# Patient Record
Sex: Female | Born: 1945 | Race: White | Hispanic: No | Smoking: Never smoker
Health system: Southern US, Community
[De-identification: ages and names within clinical notes are randomized; demographics above are authoritative.]

## PROBLEM LIST (undated history)

## (undated) DIAGNOSIS — C50919 Malignant neoplasm of unspecified site of unspecified female breast: Secondary | ICD-10-CM

## (undated) DIAGNOSIS — M1990 Unspecified osteoarthritis, unspecified site: Secondary | ICD-10-CM

## (undated) DIAGNOSIS — R51 Headache: Secondary | ICD-10-CM

## (undated) DIAGNOSIS — Z860100 Personal history of colon polyps, unspecified: Secondary | ICD-10-CM

## (undated) DIAGNOSIS — Z923 Personal history of irradiation: Secondary | ICD-10-CM

## (undated) DIAGNOSIS — G4730 Sleep apnea, unspecified: Secondary | ICD-10-CM

## (undated) DIAGNOSIS — F32A Depression, unspecified: Secondary | ICD-10-CM

## (undated) DIAGNOSIS — H8110 Benign paroxysmal vertigo, unspecified ear: Secondary | ICD-10-CM

## (undated) DIAGNOSIS — C801 Malignant (primary) neoplasm, unspecified: Secondary | ICD-10-CM

## (undated) DIAGNOSIS — R519 Headache, unspecified: Secondary | ICD-10-CM

## (undated) DIAGNOSIS — R112 Nausea with vomiting, unspecified: Secondary | ICD-10-CM

## (undated) DIAGNOSIS — Z9889 Other specified postprocedural states: Secondary | ICD-10-CM

## (undated) DIAGNOSIS — H9190 Unspecified hearing loss, unspecified ear: Secondary | ICD-10-CM

## (undated) DIAGNOSIS — K219 Gastro-esophageal reflux disease without esophagitis: Secondary | ICD-10-CM

## (undated) DIAGNOSIS — F329 Major depressive disorder, single episode, unspecified: Secondary | ICD-10-CM

## (undated) HISTORY — PX: APPENDECTOMY: SHX54

## (undated) HISTORY — PX: BREAST SURGERY: SHX581

## (undated) HISTORY — PX: ABDOMINAL HYSTERECTOMY: SHX81

## (undated) HISTORY — PX: COLONOSCOPY: SHX174

## (undated) HISTORY — PX: TUBAL LIGATION: SHX77

## (undated) HISTORY — PX: CHOLECYSTECTOMY: SHX55

---

## 2000-11-07 DIAGNOSIS — C50919 Malignant neoplasm of unspecified site of unspecified female breast: Secondary | ICD-10-CM

## 2000-11-07 HISTORY — DX: Malignant neoplasm of unspecified site of unspecified female breast: C50.919

## 2000-11-07 HISTORY — PX: BREAST EXCISIONAL BIOPSY: SUR124

## 2004-10-11 ENCOUNTER — Ambulatory Visit: Payer: Self-pay | Admitting: Oncology

## 2004-11-07 ENCOUNTER — Ambulatory Visit: Payer: Self-pay | Admitting: Oncology

## 2005-03-27 ENCOUNTER — Emergency Department: Payer: Self-pay | Admitting: Emergency Medicine

## 2005-04-01 ENCOUNTER — Emergency Department: Payer: Self-pay | Admitting: Internal Medicine

## 2005-04-11 ENCOUNTER — Ambulatory Visit: Payer: Self-pay | Admitting: Oncology

## 2005-05-07 ENCOUNTER — Ambulatory Visit: Payer: Self-pay | Admitting: Oncology

## 2005-06-07 ENCOUNTER — Ambulatory Visit: Payer: Self-pay | Admitting: Oncology

## 2005-07-08 ENCOUNTER — Ambulatory Visit: Payer: Self-pay | Admitting: Oncology

## 2005-11-25 ENCOUNTER — Ambulatory Visit: Payer: Self-pay | Admitting: Oncology

## 2005-12-08 ENCOUNTER — Ambulatory Visit: Payer: Self-pay | Admitting: Oncology

## 2006-05-25 ENCOUNTER — Ambulatory Visit: Payer: Self-pay | Admitting: Oncology

## 2006-06-07 ENCOUNTER — Ambulatory Visit: Payer: Self-pay | Admitting: Oncology

## 2006-07-08 ENCOUNTER — Ambulatory Visit: Payer: Self-pay | Admitting: Oncology

## 2006-11-16 ENCOUNTER — Ambulatory Visit: Payer: Self-pay | Admitting: Gastroenterology

## 2006-12-12 ENCOUNTER — Ambulatory Visit: Payer: Self-pay | Admitting: Oncology

## 2006-12-21 ENCOUNTER — Ambulatory Visit: Payer: Self-pay | Admitting: Oncology

## 2007-01-06 ENCOUNTER — Ambulatory Visit: Payer: Self-pay | Admitting: Oncology

## 2007-06-08 ENCOUNTER — Ambulatory Visit: Payer: Self-pay | Admitting: Oncology

## 2007-06-13 ENCOUNTER — Ambulatory Visit: Payer: Self-pay | Admitting: Oncology

## 2007-06-19 ENCOUNTER — Ambulatory Visit: Payer: Self-pay | Admitting: Oncology

## 2007-06-24 ENCOUNTER — Ambulatory Visit: Payer: Self-pay | Admitting: Orthopedic Surgery

## 2007-07-09 ENCOUNTER — Ambulatory Visit: Payer: Self-pay | Admitting: Oncology

## 2007-09-10 ENCOUNTER — Ambulatory Visit: Payer: Self-pay | Admitting: Internal Medicine

## 2007-09-11 ENCOUNTER — Ambulatory Visit: Payer: Self-pay | Admitting: Internal Medicine

## 2007-09-13 ENCOUNTER — Ambulatory Visit: Payer: Self-pay | Admitting: General Surgery

## 2007-12-09 ENCOUNTER — Ambulatory Visit: Payer: Self-pay | Admitting: Oncology

## 2007-12-17 ENCOUNTER — Ambulatory Visit: Payer: Self-pay | Admitting: Oncology

## 2008-01-06 ENCOUNTER — Ambulatory Visit: Payer: Self-pay | Admitting: Oncology

## 2008-04-07 ENCOUNTER — Ambulatory Visit: Payer: Self-pay | Admitting: Oncology

## 2008-06-07 ENCOUNTER — Ambulatory Visit: Payer: Self-pay | Admitting: Oncology

## 2008-06-16 ENCOUNTER — Ambulatory Visit: Payer: Self-pay | Admitting: Oncology

## 2008-06-26 ENCOUNTER — Ambulatory Visit: Payer: Self-pay | Admitting: Oncology

## 2008-07-08 ENCOUNTER — Ambulatory Visit: Payer: Self-pay | Admitting: Oncology

## 2009-06-07 ENCOUNTER — Ambulatory Visit: Payer: Self-pay | Admitting: Oncology

## 2009-06-17 ENCOUNTER — Ambulatory Visit: Payer: Self-pay | Admitting: Oncology

## 2009-06-25 ENCOUNTER — Ambulatory Visit: Payer: Self-pay | Admitting: Oncology

## 2009-07-08 ENCOUNTER — Ambulatory Visit: Payer: Self-pay | Admitting: Oncology

## 2010-06-07 ENCOUNTER — Ambulatory Visit: Payer: Self-pay | Admitting: Oncology

## 2010-06-18 ENCOUNTER — Ambulatory Visit: Payer: Self-pay | Admitting: Oncology

## 2010-06-25 ENCOUNTER — Ambulatory Visit: Payer: Self-pay | Admitting: Oncology

## 2010-06-27 LAB — CANCER ANTIGEN 27.29: CA 27.29: 31.8 U/mL (ref 0.0–38.6)

## 2010-07-08 ENCOUNTER — Ambulatory Visit: Payer: Self-pay | Admitting: Oncology

## 2011-06-20 ENCOUNTER — Ambulatory Visit: Payer: Self-pay | Admitting: Oncology

## 2011-06-23 ENCOUNTER — Ambulatory Visit: Payer: Self-pay | Admitting: Ophthalmology

## 2011-06-23 DIAGNOSIS — I119 Hypertensive heart disease without heart failure: Secondary | ICD-10-CM

## 2011-06-24 ENCOUNTER — Ambulatory Visit: Payer: Self-pay | Admitting: Oncology

## 2011-06-29 ENCOUNTER — Ambulatory Visit: Payer: Self-pay | Admitting: Ophthalmology

## 2011-07-09 ENCOUNTER — Ambulatory Visit: Payer: Self-pay | Admitting: Oncology

## 2012-03-26 ENCOUNTER — Ambulatory Visit: Payer: Self-pay | Admitting: Gastroenterology

## 2012-03-27 LAB — PATHOLOGY REPORT

## 2012-07-03 ENCOUNTER — Ambulatory Visit: Payer: Self-pay | Admitting: Internal Medicine

## 2012-10-28 ENCOUNTER — Ambulatory Visit: Payer: Self-pay | Admitting: Emergency Medicine

## 2012-10-28 LAB — URINALYSIS, COMPLETE
Bilirubin,UR: NEGATIVE
Ketone: NEGATIVE
Nitrite: POSITIVE
Ph: 6 (ref 4.5–8.0)
Protein: 300
RBC,UR: >30 /HPF (ref 0–5)
Specific Gravity: >=1.03 (ref 1.003–1.030)

## 2012-10-30 LAB — URINE CULTURE

## 2013-07-17 ENCOUNTER — Ambulatory Visit: Payer: Self-pay | Admitting: Internal Medicine

## 2013-12-18 IMAGING — MG MM CAD DIAGNOSTIC MAMMO
1 series · 7 of 7 positions shown · non-contrast
Comparison: none

REASON FOR EXAM: hx brst ca
COMMENTS:

[R CC · right · 7 of 7 slices shown]
[im 1/7]
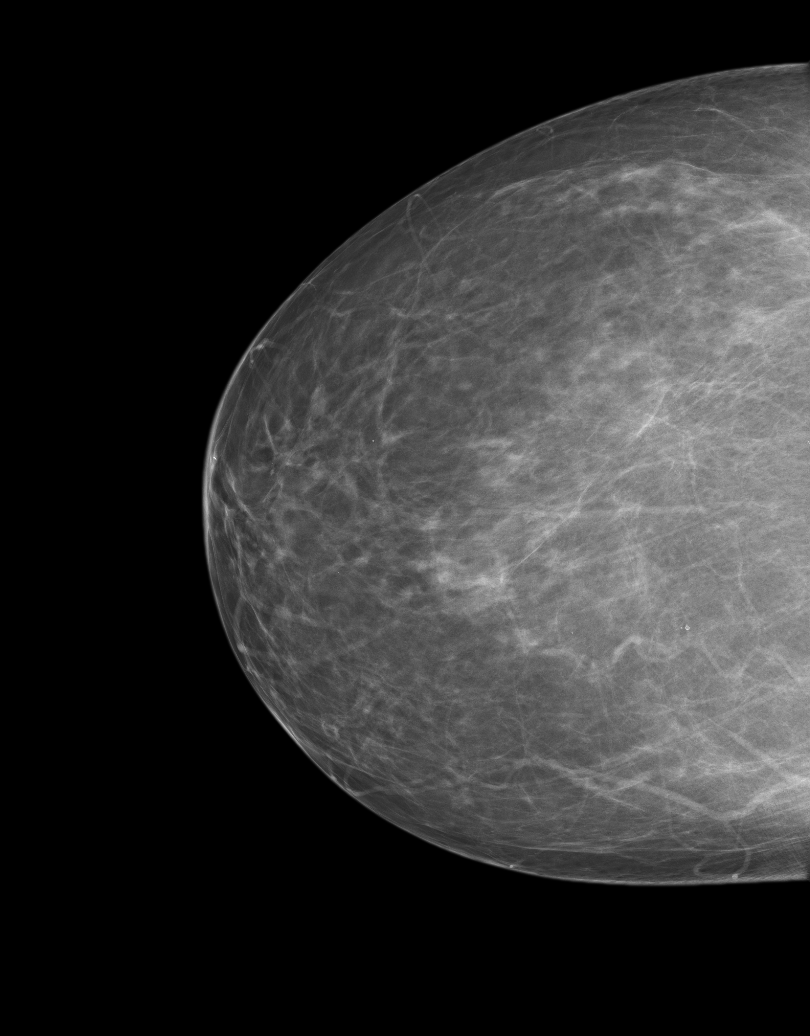
[im 2/7]
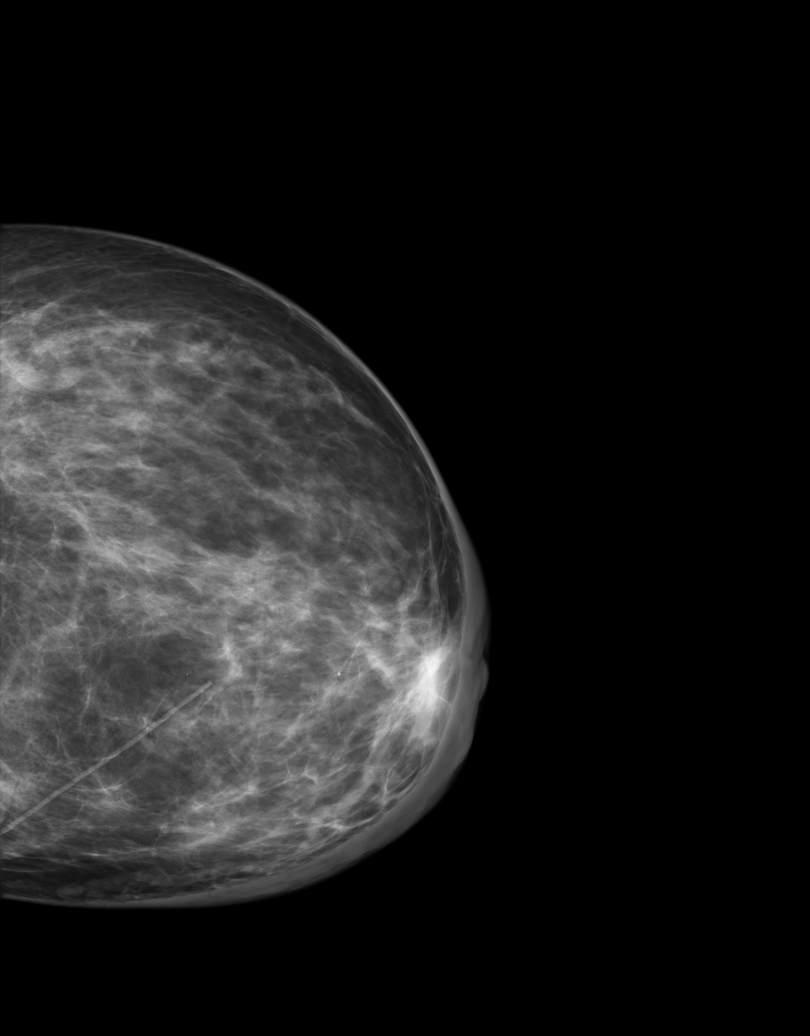
[im 3/7]
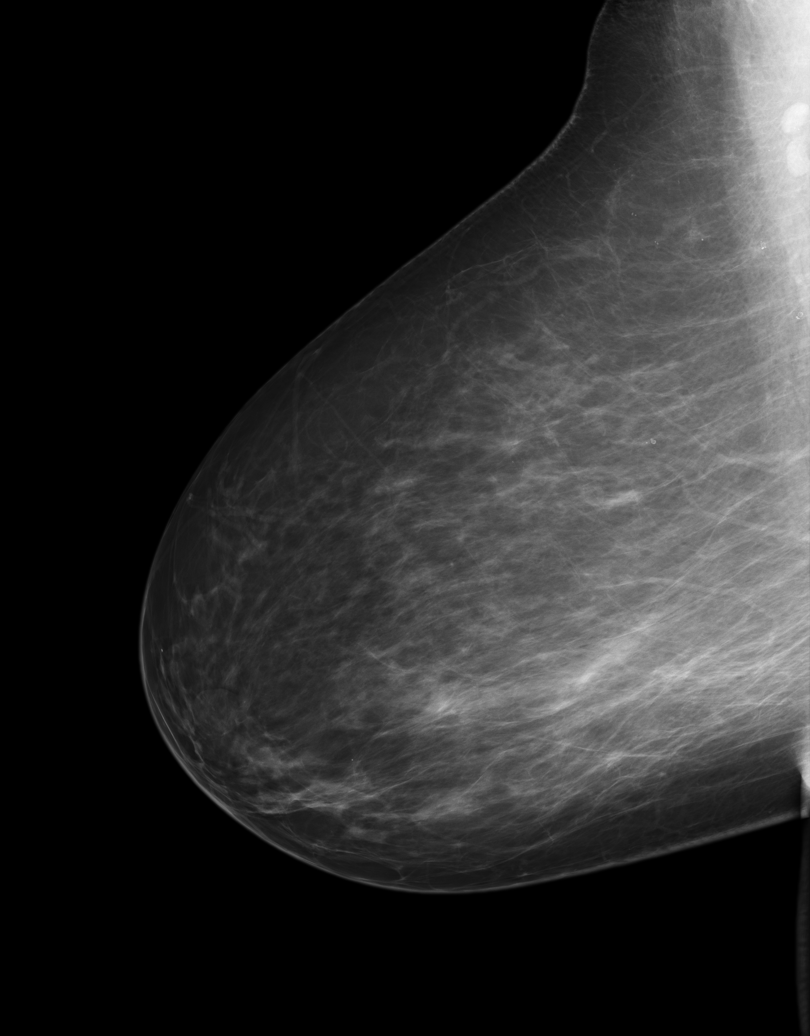
[im 4/7]
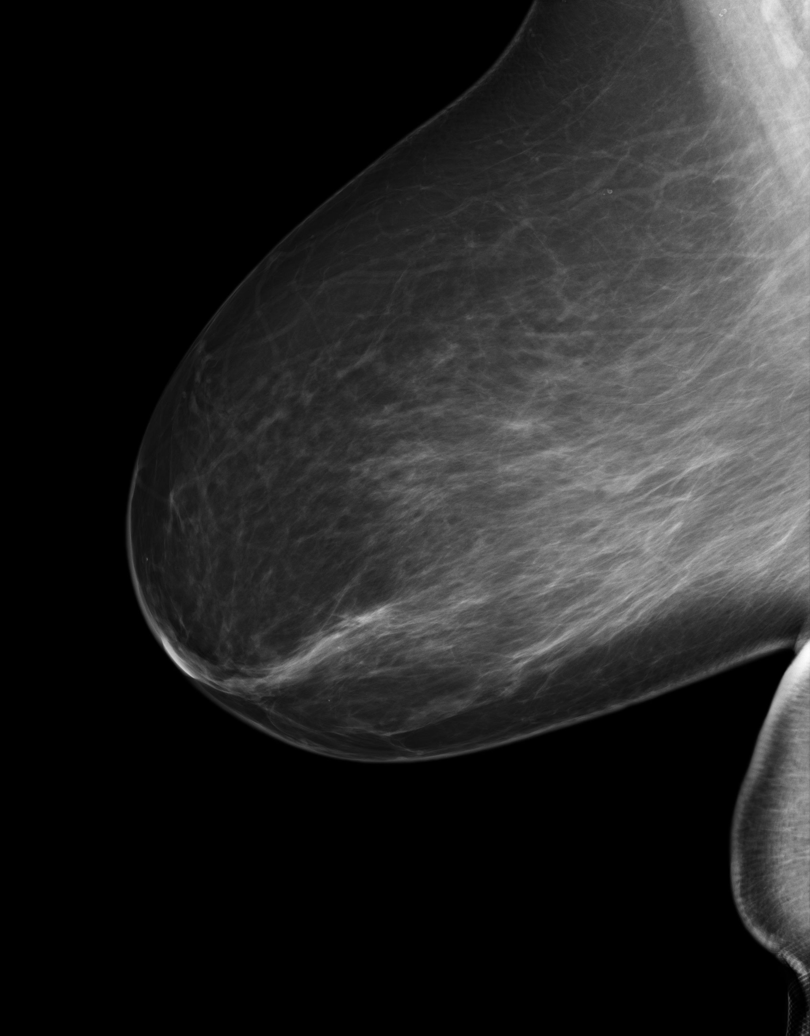
[im 5/7]
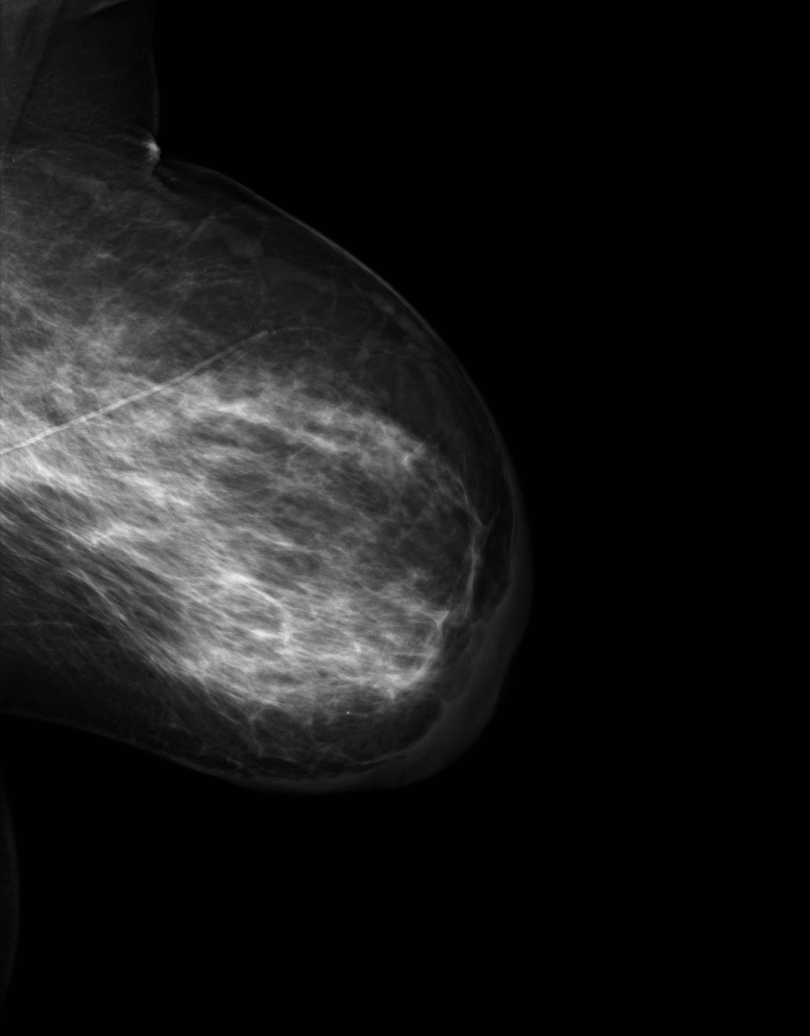
[im 6/7]
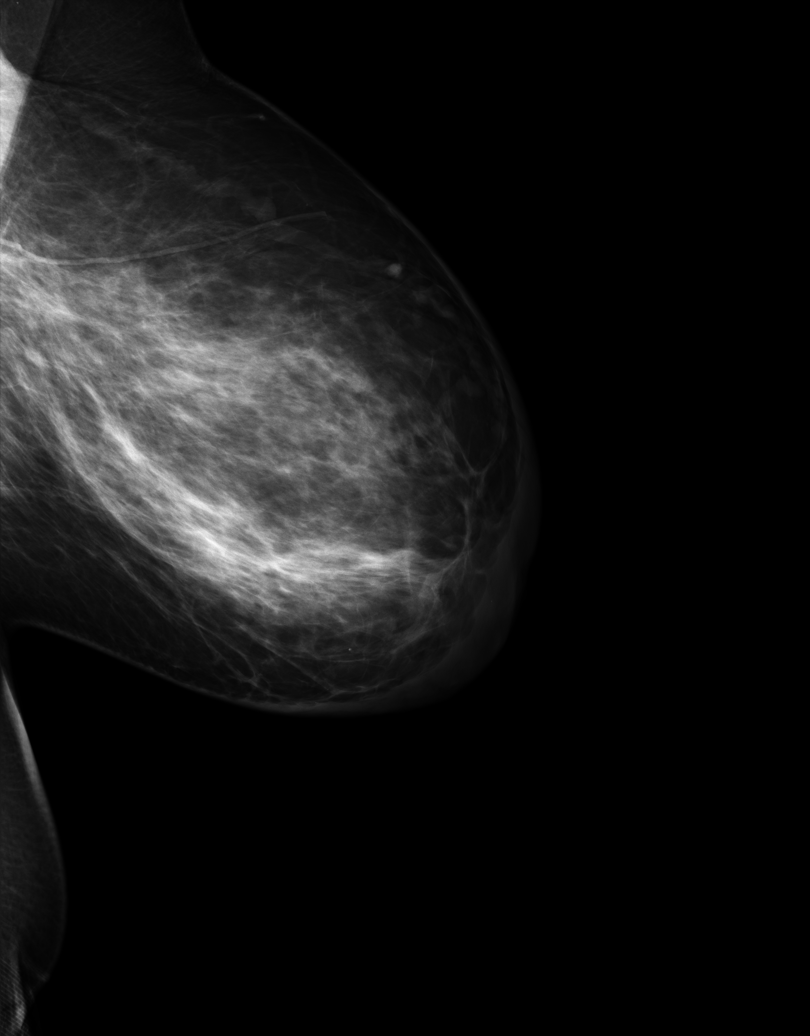
[im 7/7]
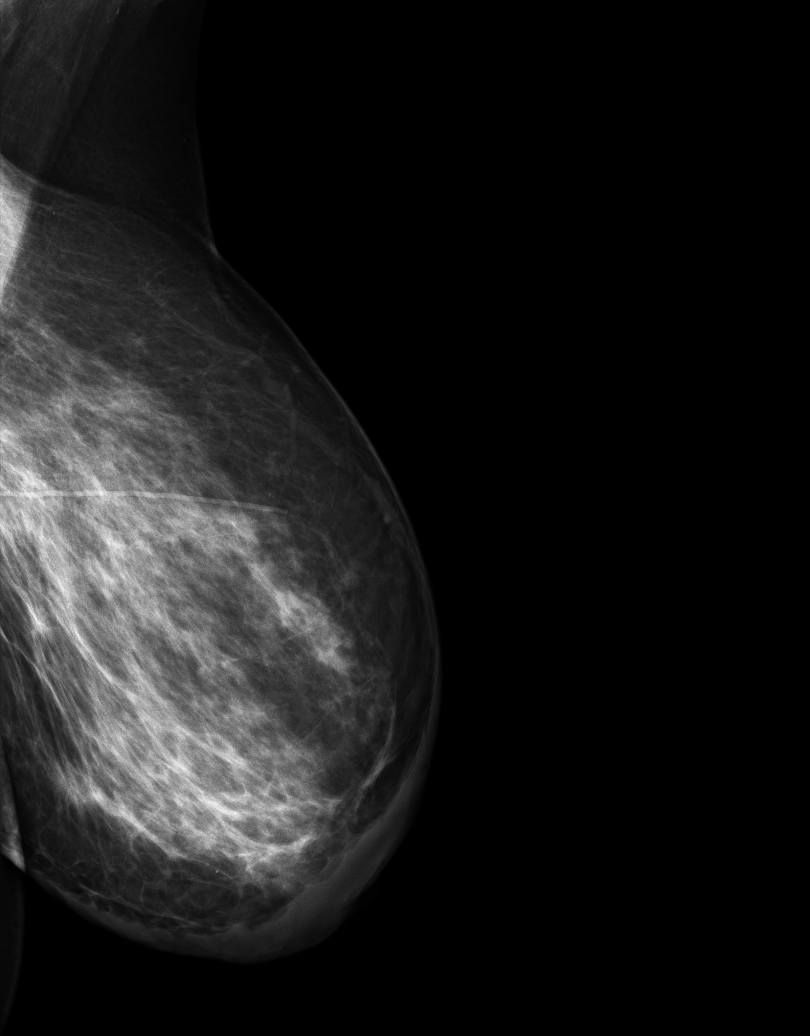

[7 of 7 positions shown; findings below may reference images not displayed]

PROCEDURE:     MAM - MAM DGTL DIAGNOSTIC MAMMO W/CAD  - July 03, 2012  [DATE]

RESULT:     Stable and left breast skin thickening is noted unchanged from
multiple prior exams is present. These changes are consistent with prior
radiation therapy and or surgery. Benign calcifications are present. Breast
are heterogeneously dense and nodular. No new abnormalities identified. CAD
evaluation nonfocal.
IMPRESSION: Stable benign exam stable skin thickening left breast.

BI-RADS: Category 2- Benign Finding

A NEGATIVE MAMMOGRAM REPORT DOES NOT PRECLUDE BIOPSY OR OTHER EVALUATION OF
A CLINICALLY PALPABLE OR OTHERWISE SUSPICIOUS MASS OR LESION. BREAST CANCER
MAY NOT BE DETECTED IN UP TO 10% OF CASES.

## 2014-07-22 ENCOUNTER — Ambulatory Visit: Payer: Self-pay | Admitting: Internal Medicine

## 2014-12-24 ENCOUNTER — Ambulatory Visit: Payer: Self-pay | Admitting: Ophthalmology

## 2015-03-11 ENCOUNTER — Encounter
Admission: RE | Disposition: A | Discharge status: Home / Self Care | Payer: Self-pay | Source: Ambulatory Visit | Attending: Gastroenterology

## 2015-03-11 ENCOUNTER — Ambulatory Visit: Payer: Medicare Other | Admitting: Student in an Organized Health Care Education/Training Program

## 2015-03-11 ENCOUNTER — Ambulatory Visit
Admission: RE | Admit: 2015-03-11 | Discharge: 2015-03-11 | Disposition: A | Discharge status: Home / Self Care | Payer: Medicare Other | Source: Ambulatory Visit | Attending: Gastroenterology | Admitting: Gastroenterology

## 2015-03-11 ENCOUNTER — Encounter: Payer: Self-pay | Admitting: *Deleted

## 2015-03-11 DIAGNOSIS — Z90710 Acquired absence of both cervix and uterus: Secondary | ICD-10-CM | POA: Diagnosis not present

## 2015-03-11 DIAGNOSIS — Z9049 Acquired absence of other specified parts of digestive tract: Secondary | ICD-10-CM | POA: Diagnosis not present

## 2015-03-11 DIAGNOSIS — G4730 Sleep apnea, unspecified: Secondary | ICD-10-CM | POA: Diagnosis not present

## 2015-03-11 DIAGNOSIS — Z86010 Personal history of colonic polyps: Secondary | ICD-10-CM | POA: Diagnosis present

## 2015-03-11 DIAGNOSIS — Z9851 Tubal ligation status: Secondary | ICD-10-CM | POA: Diagnosis not present

## 2015-03-11 DIAGNOSIS — Z9010 Acquired absence of unspecified breast and nipple: Secondary | ICD-10-CM | POA: Insufficient documentation

## 2015-03-11 DIAGNOSIS — Z853 Personal history of malignant neoplasm of breast: Secondary | ICD-10-CM | POA: Insufficient documentation

## 2015-03-11 DIAGNOSIS — K219 Gastro-esophageal reflux disease without esophagitis: Secondary | ICD-10-CM | POA: Diagnosis not present

## 2015-03-11 DIAGNOSIS — Z881 Allergy status to other antibiotic agents status: Secondary | ICD-10-CM | POA: Insufficient documentation

## 2015-03-11 DIAGNOSIS — G43909 Migraine, unspecified, not intractable, without status migrainosus: Secondary | ICD-10-CM | POA: Diagnosis not present

## 2015-03-11 DIAGNOSIS — F329 Major depressive disorder, single episode, unspecified: Secondary | ICD-10-CM | POA: Insufficient documentation

## 2015-03-11 HISTORY — DX: Other specified postprocedural states: Z98.890

## 2015-03-11 HISTORY — DX: Major depressive disorder, single episode, unspecified: F32.9

## 2015-03-11 HISTORY — DX: Headache, unspecified: R51.9

## 2015-03-11 HISTORY — DX: Headache: R51

## 2015-03-11 HISTORY — PX: COLONOSCOPY: SHX5424

## 2015-03-11 HISTORY — DX: Benign paroxysmal vertigo, unspecified ear: H81.10

## 2015-03-11 HISTORY — DX: Malignant (primary) neoplasm, unspecified: C80.1

## 2015-03-11 HISTORY — DX: Personal history of colon polyps, unspecified: Z86.0100

## 2015-03-11 HISTORY — DX: Personal history of colonic polyps: Z86.010

## 2015-03-11 HISTORY — DX: Gastro-esophageal reflux disease without esophagitis: K21.9

## 2015-03-11 HISTORY — DX: Unspecified hearing loss, unspecified ear: H91.90

## 2015-03-11 HISTORY — DX: Depression, unspecified: F32.A

## 2015-03-11 HISTORY — DX: Sleep apnea, unspecified: G47.30

## 2015-03-11 HISTORY — DX: Nausea with vomiting, unspecified: R11.2

## 2015-03-11 SURGERY — COLONOSCOPY
Anesthesia: Monitor Anesthesia Care | Wound class: Contaminated

## 2015-03-11 MED ORDER — LIDOCAINE HCL (CARDIAC) 20 MG/ML IV SOLN
INTRAVENOUS | Status: DC | PRN
Start: 2015-03-11 — End: 2015-03-11
  Administered 2015-03-11: 50 mg via INTRAVENOUS

## 2015-03-11 MED ORDER — PROPOFOL 10 MG/ML IV BOLUS
INTRAVENOUS | Status: DC | PRN
  Administered 2015-03-11: 100 mg via INTRAVENOUS
  Administered 2015-03-11 (×2): 20 mg via INTRAVENOUS
  Administered 2015-03-11: 50 mg via INTRAVENOUS
  Administered 2015-03-11: 10 mg via INTRAVENOUS
  Administered 2015-03-11: 20 mg via INTRAVENOUS

## 2015-03-11 MED ORDER — STERILE WATER FOR IRRIGATION IR SOLN
Status: DC | PRN
  Administered 2015-03-11: 150 mL

## 2015-03-11 MED ORDER — LACTATED RINGERS IV SOLN
INTRAVENOUS | Status: DC
  Administered 2015-03-11 (×2): via INTRAVENOUS

## 2015-03-11 SURGICAL SUPPLY — 29 items
CANISTER SUCT 1200ML W/VALVE (MISCELLANEOUS) ×3 IMPLANT
FCP ESCP3.2XJMB 240X2.8X (MISCELLANEOUS)
FORCEPS BIOP RAD 4 LRG CAP 4 (CUTTING FORCEPS) IMPLANT
FORCEPS BIOP RJ4 240 W/NDL (MISCELLANEOUS)
FORCEPS ESCP3.2XJMB 240X2.8X (MISCELLANEOUS) IMPLANT
GOWN CVR UNV OPN BCK APRN NK (MISCELLANEOUS) ×1 IMPLANT
GOWN ISOL THUMB LOOP REG UNIV (MISCELLANEOUS) ×2
GOWN STRL REUS W/ TWL LRG LVL3 (GOWN DISPOSABLE) ×1 IMPLANT
GOWN STRL REUS W/TWL LRG LVL3 (GOWN DISPOSABLE) ×2
HEMOCLIP INSTINCT (CLIP) IMPLANT
INJECTOR VARIJECT VIN23 (MISCELLANEOUS) IMPLANT
KIT CO2 TUBING (TUBING) ×3 IMPLANT
KIT DEFENDO VALVE AND CONN (KITS) IMPLANT
KIT ENDO PROCEDURE OLY (KITS) ×3 IMPLANT
LIGATOR MULTIBAND 6SHOOTER MBL (MISCELLANEOUS) IMPLANT
MARKER SPOT ENDO TATTOO 5ML (MISCELLANEOUS) IMPLANT
PAD GROUND ADULT SPLIT (MISCELLANEOUS) IMPLANT
SNARE SHORT THROW 13M SML OVAL (MISCELLANEOUS) IMPLANT
SNARE SHORT THROW 30M LRG OVAL (MISCELLANEOUS) IMPLANT
SPOT EX ENDOSCOPIC TATTOO (MISCELLANEOUS)
SUCTION POLY TRAP 4CHAMBER (MISCELLANEOUS) IMPLANT
TRAP SUCTION POLY (MISCELLANEOUS) IMPLANT
TUBING CONN 6MMX3.1M (TUBING)
TUBING SUCTION CONN 0.25 STRL (TUBING) IMPLANT
UNDERPAD 30X60 958B10 (PK) (MISCELLANEOUS) IMPLANT
VALVE BIOPSY ENDO (VALVE) IMPLANT
VARIJECT INJECTOR VIN23 (MISCELLANEOUS)
WATER AUXILLARY (MISCELLANEOUS) IMPLANT
WATER STERILE IRR 500ML POUR (IV SOLUTION) ×3 IMPLANT

## 2015-03-11 NOTE — Anesthesia Procedure Notes (Signed)
Procedure Name: MAC Performed by: Cameron Ali Pre-anesthesia Checklist: Patient identified, Emergency Drugs available, Suction available, Patient being monitored and Timeout performed Patient Re-evaluated:Patient Re-evaluated prior to inductionOxygen Delivery Method: Nasal cannula

## 2015-03-11 NOTE — H&P (Signed)
  Hx of colon polyps.  NKDA  PMH: see above  Meds: see above   PE: VSS, NAD  HEENT:wnl  CV:RRR  Lungs:CTA  Abdomen: NABS, soft, nontender, -HSM  Ext:-C/C/E  Imp: Personal hx of colon polyps  Plan: Colonoscopy

## 2015-03-11 NOTE — Anesthesia Preprocedure Evaluation (Signed)
Anesthesia Evaluation  Patient identified by MRN, date of birth, ID band Patient awake    Reviewed: Allergy & Precautions, H&P , NPO status , Patient's Chart, lab work & pertinent test results  History of Anesthesia Complications (+) PONV and history of anesthetic complications  Airway Mallampati: II  TM Distance: >3 FB Neck ROM: full    Dental   Pulmonary sleep apnea ,    Pulmonary exam normal       Cardiovascular Normal cardiovascular exam    Neuro/Psych  Headaches, PSYCHIATRIC DISORDERS    GI/Hepatic GERD-  ,  Endo/Other    Renal/GU      Musculoskeletal   Abdominal   Peds  Hematology   Anesthesia Other Findings   Reproductive/Obstetrics                             Anesthesia Physical Anesthesia Plan  ASA: II  Anesthesia Plan: MAC   Post-op Pain Management:    Induction:   Airway Management Planned:   Additional Equipment:   Intra-op Plan:   Post-operative Plan:   Informed Consent: I have reviewed the patients History and Physical, chart, labs and discussed the procedure including the risks, benefits and alternatives for the proposed anesthesia with the patient or authorized representative who has indicated his/her understanding and acceptance.     Plan Discussed with: CRNA  Anesthesia Plan Comments:         Anesthesia Quick Evaluation

## 2015-03-11 NOTE — H&P (Signed)
PREOPERATIVE H&P  Chief Complaint: Z86.010 PERVIOUS HX COLON POLYPS  HPI: April Crawford is a 69 y.o. female who presents for preoperative history and physical with a diagnosis of Z86.010 PERVIOUS HX COLON POLYPS. Symptoms are rated as moderate to severe, and have been worsening.  This is significantly impairing activities of daily living.  She has elected for surgical management.   Past Medical History  Diagnosis Date  . PONV (postoperative nausea and vomiting)     not with colonoscopies  . GERD (gastroesophageal reflux disease)   . Cancer     breast cancer  . Vertigo, benign positional     after been laying down  . Depression   . Hx of colonic polyps   . HOH (hard of hearing)     uses hearing aids  . Sleep apnea      not using CPAP  . Headache     migraines 1 every 6 mos or year   Past Surgical History  Procedure Laterality Date  . Tubal ligation    . Breast surgery    . Abdominal hysterectomy      x 3 surgeries  . Cholecystectomy    . Appendectomy    . Colonoscopy     History   Social History  . Marital Status: Married    Spouse Name: N/A  . Number of Children: N/A  . Years of Education: N/A   Social History Main Topics  . Smoking status: Never Smoker   . Smokeless tobacco: Not on file  . Alcohol Use: No  . Drug Use: Not on file  . Sexual Activity: Not on file   Other Topics Concern  . None   Social History Narrative   History reviewed. No pertinent family history. Allergies  Allergen Reactions  . Augmentin [Amoxicillin-Pot Clavulanate] Rash    Patient cannot remember   Prior to Admission medications   Medication Sig Start Date End Date Taking? Authorizing Provider  atorvastatin (LIPITOR) 40 MG tablet Take 40 mg by mouth daily. PM   Yes Historical Provider, MD  Calcium Carbonate-Vitamin D (CALTRATE 600+D PO) Take by mouth. Am   Yes Historical Provider, MD  DiphenhydrAMINE HCl (BENADRYL ALLERGY PO) Take by mouth as needed (when needed for rash).    Yes Historical Provider, MD  gabapentin (NEURONTIN) 100 MG capsule Take 100 mg by mouth at bedtime. PM   Yes Historical Provider, MD  Multiple Vitamins-Minerals (CENTRUM SILVER PO) Take by mouth 1 day or 1 dose.   Yes Historical Provider, MD  ranitidine (ZANTAC) 150 MG capsule Take 150 mg by mouth 1 day or 1 dose. Am   Yes Historical Provider, MD  venlafaxine (EFFEXOR) 75 MG tablet Take 75 mg by mouth 1 day or 1 dose. AM   Yes Historical Provider, MD     Positive ROS: All other systems have been reviewed and were otherwise negative with the exception of those mentioned in the HPI and as above.  Physical Exam: General: Alert, no acute distress Cardiovascular: No pedal edema Respiratory: No cyanosis, no use of accessory musculature GI: No organomegaly, abdomen is soft and non-tender Skin: No lesions in the area of chief complaint Neurologic: Sensation intact distally Psychiatric: Patient is competent for consent with normal mood and affect Lymphatic: No axillary or cervical lymphadenopathy   Assessment: Z86.010 PERVIOUS HX COLON POLYPS  Plan: Plan for Colonoscopy  The risks benefits and alternatives were discussed with the patient including but not limited to the risks of nonoperative treatment, versus  surgical intervention including infection, bleeding, nerve injury,  blood clots, cardiopulmonary complications, morbidity, mortality, among others, and they were willing to proceed.   Lorece Keach, Lupita Dawn, MD 03/11/2015 9:21 AM

## 2015-03-11 NOTE — Op Note (Signed)
[facility] Gastroenterology Patient Name: April Crawford Procedure Date: 03/11/2015 8:57 AM MRN: [id number] Account #: [id number] Date of Birth: 01-17-1946 Admit Type: Outpatient Age: 69 Room: [facility] OR ROOM 01 Gender: Female Note Status: Finalized Procedure:         Colonoscopy Indications:       Personal history of colonic polyps Providers:         Lupita Dawn. Candace Cruise, MD Referring MD:      Lupita Dawn. Candace Cruise, MD (Referring MD) Medicines:         Monitored Anesthesia Care Complications:     No immediate complications. Procedure:         Pre-Anesthesia Assessment:                    - Prior to the procedure, a History and Physical was                     performed, and patient medications, allergies and                     sensitivities were reviewed. The patient's tolerance of                     previous anesthesia was reviewed.                    - The risks and benefits of the procedure and the sedation                     options and risks were discussed with the patient. All                     questions were answered and informed consent was obtained.                    - After reviewing the risks and benefits, the patient was                     deemed in satisfactory condition to undergo the procedure.                    After obtaining informed consent, the colonoscope was                     passed under direct vision. Throughout the procedure, the                     patient's blood pressure, pulse, and oxygen saturations                     were monitored continuously. The Olympus CF H180AL                     colonoscope (S#: [id number]) was introduced through the anus                     and advanced to the the cecum, identified by appendiceal                     orifice and ileocecal valve. The colonoscopy was performed                     without difficulty. The patient tolerated the procedure  well. The quality of the bowel preparation was  fair. Findings:      The colon (entire examined portion) appeared normal. Impression:        - The entire examined colon is normal.                    - No specimens collected. Recommendation:    - Discharge patient to home.                    - Repeat colonoscopy in 5 years for surveillance.                    - The findings and recommendations were discussed with the                     patient. Procedure Code(s): --- Professional ---                    [phone], Colonoscopy, flexible; diagnostic, including                     collection of specimen(s) by brushing or washing, when                     performed (separate procedure) Diagnosis Code(s): --- Professional ---                    Z86.010, Personal history of colonic polyps CPT copyright 2014 American Medical Association. All rights reserved. The codes documented in this report are preliminary and upon coder review may  be revised to meet current compliance requirements. Hulen Luster, MD 03/11/2015 10:00:08 AM This report has been signed electronically. Number of Addenda: 0 Note Initiated On: 03/11/2015 8:57 AM Scope Withdrawal Time: 0 hours 6 minutes 25 seconds  Total Procedure Duration: 0 hours 10 minutes 26 seconds       [facility]

## 2015-03-11 NOTE — Transfer of Care (Addendum)
Immediate Anesthesia Transfer of Care Note  Patient: April Crawford  Procedure(s) Performed: Procedure(s): COLONOSCOPY (N/A)  Patient Location: PACU  Anesthesia Type: MAC  Level of Consciousness: awake, alert  and patient cooperative  Airway and Oxygen Therapy: Patient Spontanous Breathing and Patient connected to supplemental oxygen  Post-op Assessment: Post-op Vital signs reviewed, Patient's Cardiovascular Status Stable, Respiratory Function Stable, Patent Airway and No signs of Nausea or vomiting  Post-op Vital Signs: Reviewed and stable  Complications: No apparent anesthesia complications

## 2015-03-11 NOTE — Anesthesia Postprocedure Evaluation (Signed)
  Anesthesia Post-op Note  Patient: April Crawford  Procedure(s) Performed: Procedure(s): COLONOSCOPY (N/A)  Anesthesia type:MAC  Patient location: PACU  Post pain: Pain level controlled  Post assessment: Post-op Vital signs reviewed, Patient's Cardiovascular Status Stable, Respiratory Function Stable, Patent Airway and No signs of Nausea or vomiting  Post vital signs: Reviewed and stable  Last Vitals:  Filed Vitals:   03/11/15 1007  BP: 125/65  Pulse:   Temp:   Resp:     Level of consciousness: awake, alert  and patient cooperative  Complications: No apparent anesthesia complications

## 2015-03-17 ENCOUNTER — Encounter: Payer: Self-pay | Admitting: Gastroenterology

## 2015-06-19 ENCOUNTER — Other Ambulatory Visit: Payer: Self-pay | Admitting: Internal Medicine

## 2015-06-19 DIAGNOSIS — Z1231 Encounter for screening mammogram for malignant neoplasm of breast: Secondary | ICD-10-CM

## 2015-07-24 ENCOUNTER — Ambulatory Visit: Admission: RE | Admit: 2015-07-24 | Payer: Medicare Other | Source: Ambulatory Visit

## 2015-07-27 ENCOUNTER — Ambulatory Visit
Admission: RE | Admit: 2015-07-27 | Discharge: 2015-07-27 | Disposition: A | Discharge status: Home / Self Care | Payer: Medicare Other | Source: Ambulatory Visit | Attending: Internal Medicine | Admitting: Internal Medicine

## 2015-07-27 ENCOUNTER — Other Ambulatory Visit: Payer: Self-pay | Admitting: Internal Medicine

## 2015-07-27 DIAGNOSIS — Z1231 Encounter for screening mammogram for malignant neoplasm of breast: Secondary | ICD-10-CM

## 2015-07-27 HISTORY — DX: Malignant neoplasm of unspecified site of unspecified female breast: C50.919

## 2016-07-18 ENCOUNTER — Other Ambulatory Visit: Payer: Self-pay | Admitting: Internal Medicine

## 2016-07-18 DIAGNOSIS — Z1231 Encounter for screening mammogram for malignant neoplasm of breast: Secondary | ICD-10-CM

## 2016-07-28 ENCOUNTER — Ambulatory Visit
Admission: RE | Admit: 2016-07-28 | Discharge: 2016-07-28 | Disposition: A | Discharge status: Home / Self Care | Payer: Medicare Other | Source: Ambulatory Visit | Attending: Internal Medicine | Admitting: Internal Medicine

## 2016-07-28 DIAGNOSIS — Z1231 Encounter for screening mammogram for malignant neoplasm of breast: Secondary | ICD-10-CM

## 2017-06-19 ENCOUNTER — Other Ambulatory Visit: Payer: Self-pay | Admitting: Internal Medicine

## 2017-06-19 DIAGNOSIS — Z1231 Encounter for screening mammogram for malignant neoplasm of breast: Secondary | ICD-10-CM

## 2017-07-31 ENCOUNTER — Ambulatory Visit
Admission: RE | Admit: 2017-07-31 | Discharge: 2017-07-31 | Disposition: A | Discharge status: Home / Self Care | Payer: Medicare Other | Source: Ambulatory Visit | Attending: Internal Medicine | Admitting: Internal Medicine

## 2017-07-31 DIAGNOSIS — Z1231 Encounter for screening mammogram for malignant neoplasm of breast: Secondary | ICD-10-CM | POA: Diagnosis present

## 2018-05-09 ENCOUNTER — Other Ambulatory Visit: Payer: Self-pay

## 2018-05-09 ENCOUNTER — Ambulatory Visit
Admission: EM | Admit: 2018-05-09 | Discharge: 2018-05-09 | Disposition: A | Discharge status: Home / Self Care | Payer: Medicare Other | Attending: Family Medicine | Admitting: Family Medicine

## 2018-05-09 ENCOUNTER — Encounter: Payer: Self-pay | Admitting: Emergency Medicine

## 2018-05-09 DIAGNOSIS — Z9889 Other specified postprocedural states: Secondary | ICD-10-CM | POA: Diagnosis not present

## 2018-05-09 DIAGNOSIS — K219 Gastro-esophageal reflux disease without esophagitis: Secondary | ICD-10-CM | POA: Insufficient documentation

## 2018-05-09 DIAGNOSIS — Z79899 Other long term (current) drug therapy: Secondary | ICD-10-CM | POA: Diagnosis not present

## 2018-05-09 DIAGNOSIS — Z923 Personal history of irradiation: Secondary | ICD-10-CM | POA: Diagnosis not present

## 2018-05-09 DIAGNOSIS — R350 Frequency of micturition: Secondary | ICD-10-CM | POA: Diagnosis not present

## 2018-05-09 DIAGNOSIS — Z86010 Personal history of colonic polyps: Secondary | ICD-10-CM | POA: Diagnosis not present

## 2018-05-09 DIAGNOSIS — N898 Other specified noninflammatory disorders of vagina: Secondary | ICD-10-CM | POA: Insufficient documentation

## 2018-05-09 DIAGNOSIS — Z8249 Family history of ischemic heart disease and other diseases of the circulatory system: Secondary | ICD-10-CM | POA: Insufficient documentation

## 2018-05-09 DIAGNOSIS — Z9049 Acquired absence of other specified parts of digestive tract: Secondary | ICD-10-CM | POA: Diagnosis not present

## 2018-05-09 DIAGNOSIS — E785 Hyperlipidemia, unspecified: Secondary | ICD-10-CM | POA: Insufficient documentation

## 2018-05-09 DIAGNOSIS — F329 Major depressive disorder, single episode, unspecified: Secondary | ICD-10-CM | POA: Diagnosis not present

## 2018-05-09 DIAGNOSIS — G4730 Sleep apnea, unspecified: Secondary | ICD-10-CM | POA: Diagnosis not present

## 2018-05-09 DIAGNOSIS — Z880 Allergy status to penicillin: Secondary | ICD-10-CM | POA: Diagnosis not present

## 2018-05-09 DIAGNOSIS — Z801 Family history of malignant neoplasm of trachea, bronchus and lung: Secondary | ICD-10-CM | POA: Insufficient documentation

## 2018-05-09 DIAGNOSIS — R300 Dysuria: Secondary | ICD-10-CM | POA: Diagnosis present

## 2018-05-09 DIAGNOSIS — Z853 Personal history of malignant neoplasm of breast: Secondary | ICD-10-CM | POA: Diagnosis not present

## 2018-05-09 DIAGNOSIS — Z90710 Acquired absence of both cervix and uterus: Secondary | ICD-10-CM | POA: Diagnosis not present

## 2018-05-09 LAB — URINALYSIS, COMPLETE (UACMP) WITH MICROSCOPIC
Bilirubin Urine: NEGATIVE
Glucose, UA: NEGATIVE mg/dL
Nitrite: NEGATIVE
PH: 6 (ref 5.0–8.0)
Specific Gravity, Urine: >1.03 — ABNORMAL HIGH (ref 1.005–1.030)
WBC, UA: >50 WBC/hpf (ref 0–5)

## 2018-05-09 LAB — WET PREP, GENITAL
CLUE CELLS WET PREP: NONE SEEN
Sperm: NONE SEEN
TRICH WET PREP: NONE SEEN
YEAST WET PREP: NONE SEEN

## 2018-05-09 MED ORDER — FLUCONAZOLE 150 MG PO TABS: 150.0000 mg | ORAL_TABLET | Freq: Once | ORAL | 0 refills | Status: AC

## 2018-05-09 MED ORDER — SULFAMETHOXAZOLE-TRIMETHOPRIM 800-160 MG PO TABS: 1.0000 | ORAL_TABLET | Freq: Two times a day (BID) | ORAL | 0 refills | Status: AC

## 2018-05-09 NOTE — ED Provider Notes (Signed)
[facility]    CSN: [id number] Arrival date & time: 05/09/18  1200     History   Chief Complaint Chief Complaint  Patient presents with  . Dysuria    HPI April Crawford is a 72 y.o. female.   72 year old female presents with urinary frequency, urgency for the past 4 days. Also slight dysuria that has gotten worse in the past day. Started having right sided lower flank pain today as well. Denies any fever, hematuria, nausea, vomiting or diarrhea. Also having slight vaginal itching, irritation and thin white discharge the past 4 to 5 days. Has tried Douching with different brands 3 times in the past few days with minimal relief. Also tried using Vagisil yesterday with no relief. Last UTI was at least 3 to 4 years ago. Other chronic health issues include hyperlipidemia, depression, and GERD and currently on Lipitor, Effexor, Neurontin daily and Zantac and Benadryl prn.   The history is provided by the patient.    Past Medical History:  Diagnosis Date  . Breast cancer ([place]) 2002   radiaiton  . Cancer [facility])    breast cancer  . Depression   . GERD (gastroesophageal reflux disease)   . Headache    migraines 1 every 6 mos or year  . HOH (hard of hearing)    uses hearing aids  . Hx of colonic polyps   . PONV (postoperative nausea and vomiting)    not with colonoscopies  . Sleep apnea     not using CPAP  . Vertigo, benign positional    after been laying down    There are no active problems to display for this patient.   Past Surgical History:  Procedure Laterality Date  . ABDOMINAL HYSTERECTOMY     x 3 surgeries  . APPENDECTOMY    . BREAST EXCISIONAL BIOPSY Left 2002   pos  . BREAST SURGERY    . CHOLECYSTECTOMY    . COLONOSCOPY    . COLONOSCOPY N/A 03/11/2015   Procedure: COLONOSCOPY;  Surgeon: Hulen Luster, MD;  Location: [place];  Service: Gastroenterology;  Laterality: N/A;  . TUBAL LIGATION      OB History   None      Home Medications      Prior to Admission medications   Medication Sig Start Date End Date Taking? Authorizing Provider  atorvastatin (LIPITOR) 40 MG tablet Take 40 mg by mouth daily. PM   Yes [provider]  Calcium Carbonate-Vitamin D (CALTRATE 600+D PO) Take by mouth. Am   Yes [provider]  DiphenhydrAMINE HCl (BENADRYL ALLERGY PO) Take by mouth as needed (when needed for rash).   Yes [provider]  gabapentin (NEURONTIN) 100 MG capsule Take 100 mg by mouth at bedtime. PM   Yes [provider]  Multiple Vitamins-Minerals (CENTRUM SILVER PO) Take by mouth 1 day or 1 dose.   Yes [provider]  Omega-3 Fatty Acids (FISH OIL) 600 MG CAPS Take 1 capsule by mouth daily.   Yes [provider]  ranitidine (ZANTAC) 150 MG capsule Take 150 mg by mouth 1 day or 1 dose. Am   Yes [provider]  venlafaxine (EFFEXOR) 75 MG tablet Take 75 mg by mouth 1 day or 1 dose. AM   Yes [provider]  fluconazole (DIFLUCAN) 150 MG tablet Take 1 tablet (150 mg total) by mouth once for 1 dose. Repeat 1 tablet by mouth at end of antibiotic use 05/09/18  05/09/18  Katy Apo, NP  sulfamethoxazole-trimethoprim (BACTRIM DS,SEPTRA DS) 800-160 MG tablet Take 1 tablet by mouth 2 (two) times daily for 7 days. 05/09/18 05/16/18  Katy Apo, NP    Family History Family History  Problem Relation Age of Onset  . Alzheimer's disease Mother   . Lung cancer Father   . Hypertension Father     Social History Social History   Tobacco Use  . Smoking status: Never Smoker  . Smokeless tobacco: Never Used  Substance Use Topics  . Alcohol use: No  . Drug use: Not on file     Allergies   Augmentin [amoxicillin-pot clavulanate]   Review of Systems Review of Systems  Constitutional: Positive for fatigue. Negative for activity change, appetite change, chills and fever.  HENT: Positive for hearing loss. Negative for congestion, mouth sores and sore throat.    Cardiovascular: Negative for chest pain and palpitations.  Gastrointestinal: Positive for abdominal pain (lower suprapubic pressure). Negative for constipation, diarrhea, nausea and vomiting.  Genitourinary: Positive for decreased urine volume, dysuria, flank pain (right), frequency, urgency and vaginal discharge. Negative for difficulty urinating, genital sores, hematuria, pelvic pain, vaginal bleeding and vaginal pain.  Musculoskeletal: Negative for arthralgias, back pain and myalgias.  Skin: Negative for color change, rash and wound.  Allergic/Immunologic: Negative for immunocompromised state.  Neurological: Positive for light-headedness. Negative for dizziness, tremors, syncope, speech difficulty, weakness, numbness and headaches.  Hematological: Negative for adenopathy. Does not bruise/bleed easily.     Physical Exam Triage Vital Signs ED Triage Vitals  Enc Vitals Group     BP 05/09/18 1216 138/62     Pulse Rate 05/09/18 1216 77     Resp 05/09/18 1216 16     Temp 05/09/18 1216 97.9 F (36.6 C)     Temp Source 05/09/18 1216 Oral     SpO2 05/09/18 1216 98 %     Weight 05/09/18 1215 160 lb (72.6 kg)     Height 05/09/18 1215 5\' 2"  (1.575 m)     Head Circumference --      Peak Flow --      Pain Score 05/09/18 1215 5     Pain Loc --      Pain Edu? --      Excl. in [place]? --    No data found.  Updated Vital Signs BP 138/62 (BP Location: Left Arm)   Pulse 77   Temp 97.9 F (36.6 C) (Oral)   Resp 16   Ht 5\' 2"  (1.575 m)   Wt 160 lb (72.6 kg)   SpO2 98%   BMI 29.26 kg/m   Visual Acuity Right Eye Distance:   Left Eye Distance:   Bilateral Distance:    Right Eye Near:   Left Eye Near:    Bilateral Near:     Physical Exam  Constitutional: She is oriented to person, place, and time. Vital signs are normal. She appears well-developed and well-nourished. She is cooperative. She does not appear ill. No distress.  Patient sitting in exam chair in no acute distress  HENT:   Head: Normocephalic and atraumatic.  Eyes: Conjunctivae and EOM are normal.  Neck: Normal range of motion.  Cardiovascular: Normal rate, regular rhythm and normal heart sounds.  No murmur heard. Pulmonary/Chest: Effort normal and breath sounds normal. No respiratory distress.  Abdominal: Soft. Normal appearance and bowel sounds are normal. There is no hepatosplenomegaly. There is tenderness in the suprapubic area. There is no rigidity, no rebound, no guarding  and no CVA tenderness.  Genitourinary:  Genitourinary Comments: Pelvic exam not performed- patient performed self-swab for wet prep.   Musculoskeletal: Normal range of motion.  Neurological: She is alert and oriented to person, place, and time.  Skin: Skin is warm and dry. No rash noted.  Psychiatric: She has a normal mood and affect. Her behavior is normal. Judgment and thought content normal.  Vitals reviewed.    UC Treatments / Results  Labs (all labs ordered are listed, but only abnormal results are displayed) Labs Reviewed  WET PREP, GENITAL - Abnormal; Notable for the following components:      Result Value   WBC, Wet Prep HPF POC FEW (*)    All other components within normal limits  URINALYSIS, COMPLETE (UACMP) WITH MICROSCOPIC - Abnormal; Notable for the following components:   APPearance CLOUDY (*)    Specific Gravity, Urine >1.030 (*)    Hgb urine dipstick LARGE (*)    Ketones, ur TRACE (*)    Protein, ur >300 (*)    Leukocytes, UA MODERATE (*)    Bacteria, UA FEW (*)    All other components within normal limits  URINE CULTURE    EKG None  Radiology No results found.  Procedures Procedures (including critical care time)  Medications Ordered in UC Medications - No data to display  Initial Impression / Assessment and Plan / UC Course  I have reviewed the triage vital signs and the nursing notes.  Pertinent labs & imaging results that were available during my care of the patient were reviewed by me  and considered in my medical decision making (see chart for details).    Reviewed urinalysis results with patient- possible UTI. Will start with Bactrim twice a day as directed. Wet prep shows no yeast, trich or BV. Discussed that irritation may be continuing due to frequent douching. Recommend stop douching. Will provide Diflucan 150mg  one time today and may repeat in 7 days to prevent yeast vaginitis from antibiotic use. Urine sent for culture. Recommend increase fluid intake. Follow-up with your PCP in 5 days if vaginal symptoms have not improve and follow-up pending urine culture results.  Final Clinical Impressions(s) / UC Diagnoses   Final diagnoses:  Dysuria  Urinary frequency  Vaginal irritation     Discharge Instructions     Recommend start Bactrim twice a day as directed. Increase fluid intake. May take Diflucan 150mg  one tablet today, repeat 1 tablet in 7 days to prevent yeast vaginitis. Recommend follow-up pending urine culture results.     ED Prescriptions    Medication Sig Dispense Auth. Provider   sulfamethoxazole-trimethoprim (BACTRIM DS,SEPTRA DS) 800-160 MG tablet Take 1 tablet by mouth 2 (two) times daily for 7 days. 14 tablet Katy Apo, NP   fluconazole (DIFLUCAN) 150 MG tablet Take 1 tablet (150 mg total) by mouth once for 1 dose. Repeat 1 tablet by mouth at end of antibiotic use 2 tablet Katy Apo, NP     Controlled Substance Prescriptions [place] Controlled Substance Registry consulted? Not Applicable   Katy Apo, NP 05/09/18 1420

## 2018-05-09 NOTE — ED Triage Notes (Signed)
Patient in today c/o 4 days urinary frequency/urgency and slight dysuria. Patient also stating that she has vaginal itching and discharge. Patient has tried OTC Cystex and Vagisil without relief.

## 2018-05-09 NOTE — Discharge Instructions (Signed)
Recommend start Bactrim twice a day as directed. Increase fluid intake. May take Diflucan 150mg  one tablet today, repeat 1 tablet in 7 days to prevent yeast vaginitis. Recommend follow-up pending urine culture results.

## 2018-05-11 ENCOUNTER — Telehealth (HOSPITAL_COMMUNITY): Payer: Self-pay

## 2018-05-11 LAB — URINE CULTURE: Special Requests: NORMAL

## 2018-05-11 NOTE — Telephone Encounter (Signed)
Urine culture positive for e. Coli. This was treated at urgent care visit. Attempted to reach patient. No answer at this time.

## 2018-09-06 ENCOUNTER — Other Ambulatory Visit: Payer: Self-pay | Admitting: Internal Medicine

## 2018-09-06 DIAGNOSIS — Z1231 Encounter for screening mammogram for malignant neoplasm of breast: Secondary | ICD-10-CM

## 2018-09-19 ENCOUNTER — Ambulatory Visit
Admission: RE | Admit: 2018-09-19 | Discharge: 2018-09-19 | Disposition: A | Discharge status: Home / Self Care | Payer: Medicare Other | Source: Ambulatory Visit | Attending: Internal Medicine | Admitting: Internal Medicine

## 2018-09-19 DIAGNOSIS — Z1231 Encounter for screening mammogram for malignant neoplasm of breast: Secondary | ICD-10-CM | POA: Insufficient documentation

## 2018-09-19 HISTORY — DX: Personal history of irradiation: Z92.3

## 2019-08-14 ENCOUNTER — Other Ambulatory Visit: Payer: Self-pay | Admitting: Internal Medicine

## 2019-08-14 DIAGNOSIS — Z1231 Encounter for screening mammogram for malignant neoplasm of breast: Secondary | ICD-10-CM

## 2019-09-23 ENCOUNTER — Other Ambulatory Visit: Payer: Self-pay

## 2019-09-23 ENCOUNTER — Ambulatory Visit
Admission: RE | Admit: 2019-09-23 | Discharge: 2019-09-23 | Disposition: A | Discharge status: Home / Self Care | Payer: Medicare Other | Source: Ambulatory Visit | Attending: Internal Medicine | Admitting: Internal Medicine

## 2019-09-23 DIAGNOSIS — Z1231 Encounter for screening mammogram for malignant neoplasm of breast: Secondary | ICD-10-CM | POA: Diagnosis present

## 2020-03-02 ENCOUNTER — Encounter: Payer: Self-pay | Admitting: Emergency Medicine

## 2020-03-02 ENCOUNTER — Other Ambulatory Visit: Payer: Self-pay

## 2020-03-02 ENCOUNTER — Ambulatory Visit
Admission: EM | Admit: 2020-03-02 | Discharge: 2020-03-02 | Disposition: A | Discharge status: Home / Self Care | Payer: Medicare Other | Attending: Family Medicine | Admitting: Family Medicine

## 2020-03-02 DIAGNOSIS — R112 Nausea with vomiting, unspecified: Secondary | ICD-10-CM

## 2020-03-02 LAB — COMPREHENSIVE METABOLIC PANEL
ALT: 40 U/L (ref 0–44)
AST: 37 U/L (ref 15–41)
Albumin: 4.4 g/dL (ref 3.5–5.0)
Alkaline Phosphatase: 101 U/L (ref 38–126)
Anion gap: 11 (ref 5–15)
BUN: 9 mg/dL (ref 8–23)
CO2: 26 mmol/L (ref 22–32)
Calcium: 9.4 mg/dL (ref 8.9–10.3)
Chloride: 102 mmol/L (ref 98–111)
Creatinine, Ser: 0.63 mg/dL (ref 0.44–1.00)
GFR calc Af Amer: >60 mL/min (ref >60)
GFR calc non Af Amer: >60 mL/min (ref >60)
Glucose, Bld: 152 mg/dL — ABNORMAL HIGH (ref 70–99)
Potassium: 3.7 mmol/L (ref 3.5–5.1)
Sodium: 139 mmol/L (ref 135–145)
Total Bilirubin: 1 mg/dL (ref 0.3–1.2)
Total Protein: 8 g/dL (ref 6.5–8.1)

## 2020-03-02 LAB — CBC WITH DIFFERENTIAL/PLATELET
Abs Immature Granulocytes: 0.06 10*3/uL (ref 0.00–0.07)
Basophils Absolute: 0 10*3/uL (ref 0.0–0.1)
Basophils Relative: 0 %
Eosinophils Absolute: 0 10*3/uL (ref 0.0–0.5)
Eosinophils Relative: 0 %
HCT: 41.9 % (ref 36.0–46.0)
Hemoglobin: 14 g/dL (ref 12.0–15.0)
Immature Granulocytes: 1 %
Lymphocytes Relative: 13 %
Lymphs Abs: 1.1 10*3/uL (ref 0.7–4.0)
MCH: 29.9 pg (ref 26.0–34.0)
MCHC: 33.4 g/dL (ref 30.0–36.0)
MCV: 89.3 fL (ref 80.0–100.0)
Monocytes Absolute: 0.5 10*3/uL (ref 0.1–1.0)
Monocytes Relative: 6 %
Neutro Abs: 7.1 10*3/uL (ref 1.7–7.7)
Neutrophils Relative %: 80 %
Platelets: 305 10*3/uL (ref 150–400)
RBC: 4.69 MIL/uL (ref 3.87–5.11)
RDW: 13 % (ref 11.5–15.5)
WBC: 8.8 10*3/uL (ref 4.0–10.5)
nRBC: 0 % (ref 0.0–0.2)

## 2020-03-02 LAB — LIPASE, BLOOD: Lipase: 27 U/L (ref 11–51)

## 2020-03-02 MED ORDER — SODIUM CHLORIDE 0.9 % IV BOLUS
1000.0000 mL | Freq: Once | INTRAVENOUS | Status: AC
  Administered 2020-03-02: 1000 mL via INTRAVENOUS

## 2020-03-02 MED ORDER — ONDANSETRON HCL 4 MG PO TABS: 4.0000 mg | ORAL_TABLET | Freq: Three times a day (TID) | ORAL | 0 refills | Status: DC | PRN

## 2020-03-02 MED ORDER — ONDANSETRON HCL 4 MG/2ML IJ SOLN
4.0000 mg | Freq: Once | INTRAMUSCULAR | Status: AC
  Administered 2020-03-02: 16:00:00 4 mg via INTRAVENOUS

## 2020-03-02 NOTE — ED Provider Notes (Signed)
[facility]    CSN: [id number] Arrival date & time: 03/02/20  1508  History   Chief Complaint Chief Complaint  Patient presents with  . Emesis   HPI   74 year old female presents with vomiting.  Started yesterday morning.  She has had several episodes of nausea & emesis.  She states that she is unable to keep anything down.  No diarrhea.  No fever.  No sick contacts.  No medications or interventions tried.  No relieving factors.  No other complaints at this time.  Past Medical History:  Diagnosis Date  . Breast cancer ([place]) 2002   radiaiton  . Cancer [facility])    breast cancer  . Depression   . GERD (gastroesophageal reflux disease)   . Headache    migraines 1 every 6 mos or year  . HOH (hard of hearing)    uses hearing aids  . Hx of colonic polyps   . Personal history of radiation therapy   . PONV (postoperative nausea and vomiting)    not with colonoscopies  . Sleep apnea     not using CPAP  . Vertigo, benign positional    after been laying down    There are no problems to display for this patient.   Past Surgical History:  Procedure Laterality Date  . ABDOMINAL HYSTERECTOMY     x 3 surgeries  . APPENDECTOMY    . BREAST EXCISIONAL BIOPSY Left 2002   pos  . BREAST SURGERY    . CHOLECYSTECTOMY    . COLONOSCOPY    . COLONOSCOPY N/A 03/11/2015   Procedure: COLONOSCOPY;  Surgeon: Hulen Luster, MD;  Location: [place];  Service: Gastroenterology;  Laterality: N/A;  . TUBAL LIGATION      OB History   No obstetric history on file.      Home Medications    Prior to Admission medications   Medication Sig Start Date End Date Taking? Authorizing Provider  atorvastatin (LIPITOR) 40 MG tablet Take 40 mg by mouth daily. PM   Yes [provider]  Calcium Carbonate-Vitamin D (CALTRATE 600+D PO) Take by mouth. Am   Yes [provider]  gabapentin (NEURONTIN) 100 MG capsule Take 100 mg by mouth at bedtime. PM   Yes [provider]  Multiple Vitamins-Minerals (CENTRUM SILVER PO) Take by mouth 1 day or 1 dose.   Yes [provider]  Omega-3 Fatty Acids (FISH OIL) 600 MG CAPS Take 1 capsule by mouth daily.   Yes [provider]  ranitidine (ZANTAC) 150 MG capsule Take 150 mg by mouth 1 day or 1 dose. Am   Yes [provider]  venlafaxine (EFFEXOR) 75 MG tablet Take 75 mg by mouth 1 day or 1 dose. AM   Yes [provider]  DiphenhydrAMINE HCl (BENADRYL ALLERGY PO) Take by mouth as needed (when needed for rash).    [provider]  ondansetron (ZOFRAN) 4 MG tablet Take 1 tablet (4 mg total) by mouth every 8 (eight) hours as needed for nausea or vomiting. 03/02/20   Coral Spikes, DO    Family History Family History  Problem Relation Age of Onset  . Alzheimer's disease Mother   . Lung cancer Father   . Hypertension Father   . Breast cancer Neg Hx     Social History Social History   Tobacco Use  . Smoking status: Never Smoker  . Smokeless tobacco: Never Used  Substance Use Topics  . Alcohol use:  No  . Drug use: Never     Allergies   Augmentin [amoxicillin-pot clavulanate]   Review of Systems Review of Systems  Constitutional: Positive for appetite change.  Gastrointestinal: Positive for nausea and vomiting.   Physical Exam Triage Vital Signs ED Triage Vitals  Enc Vitals Group     BP 03/02/20 1524 (!) 160/76     Pulse Rate 03/02/20 1524 77     Resp 03/02/20 1524 18     Temp 03/02/20 1524 98.2 F (36.8 C)     Temp Source 03/02/20 1524 Oral     SpO2 03/02/20 1524 100 %     Weight 03/02/20 1521 170 lb (77.1 kg)     Height 03/02/20 1521 5\' 2"  (1.575 m)     Head Circumference --      Peak Flow --      Pain Score 03/02/20 1521 0     Pain Loc --      Pain Edu? --      Excl. in [place]? --    Updated Vital Signs BP (!) 160/76 (BP Location: Left Arm)   Pulse 77   Temp 98.2 F (36.8 C) (Oral)   Resp 18   Ht 5\' 2"  (1.575 m)   Wt 77.1 kg    SpO2 100%   BMI 31.09 kg/m   Visual Acuity Right Eye Distance:   Left Eye Distance:   Bilateral Distance:    Right Eye Near:   Left Eye Near:    Bilateral Near:     Physical Exam Vitals and nursing note reviewed.  Constitutional:      Comments: Appears fatigued.  No acute distress.  HENT:     Head: Normocephalic and atraumatic.  Eyes:     General:        Right eye: No discharge.        Left eye: No discharge.     Conjunctiva/sclera: Conjunctivae normal.  Cardiovascular:     Rate and Rhythm: Normal rate and regular rhythm.     Heart sounds: No murmur.  Pulmonary:     Effort: Pulmonary effort is normal.     Breath sounds: Normal breath sounds. No wheezing, rhonchi or rales.  Abdominal:     General: There is no distension.     Palpations: Abdomen is soft.     Tenderness: There is no abdominal tenderness.  Neurological:     Mental Status: She is alert.  Psychiatric:        Mood and Affect: Mood normal.        Behavior: Behavior normal.    UC Treatments / Results  Labs (all labs ordered are listed, but only abnormal results are displayed) Labs Reviewed  COMPREHENSIVE METABOLIC PANEL - Abnormal; Notable for the following components:      Result Value   Glucose, Bld 152 (*)    All other components within normal limits  CBC WITH DIFFERENTIAL/PLATELET  LIPASE, BLOOD    EKG   Radiology No results found.  Procedures Procedures (including critical care time)  Medications Ordered in UC Medications  sodium chloride 0.9 % bolus 1,000 mL (0 mLs Intravenous Stopped 03/02/20 1658)  ondansetron (ZOFRAN) injection 4 mg (4 mg Intravenous Given 03/02/20 1615)    Initial Impression / Assessment and Plan / UC Course  I have reviewed the triage vital signs and the nursing notes.  Pertinent labs & imaging results that were available during my care of the patient were reviewed by me and considered in my  medical decision making (see chart for details).    74 year old  female presents with intractable nausea & vomiting (acute illness with systemic symptoms).  Patient given IV fluids, IV Zofran here.  No vomiting here.  Patient reported improvement in symptoms with treatment.  Laboratory studies only notable for elevated glucose of 152.  No evidence of infection or acute abdominal process.  Discharged home on Zofran.  Supportive care.  Final Clinical Impressions(s) / UC Diagnoses   Final diagnoses:  Intractable vomiting with nausea, unspecified vomiting type     Discharge Instructions     Labs reassuring.  Zofran as needed.  If you worsen, go to the ER.  Take care  Dr. Lacinda Axon    ED Prescriptions    Medication Sig Dispense Auth. Provider   ondansetron (ZOFRAN) 4 MG tablet Take 1 tablet (4 mg total) by mouth every 8 (eight) hours as needed for nausea or vomiting. 20 tablet Coral Spikes, DO     PDMP not reviewed this encounter.   Coral Spikes, [place] 03/02/20 2113

## 2020-03-02 NOTE — ED Triage Notes (Signed)
Patient c/o vomiting that started yesterday morning. Denies diarrhea, denies fever.

## 2020-03-02 NOTE — Discharge Instructions (Addendum)
Labs reassuring.  Zofran as needed.  If you worsen, go to the ER.  Take care  Dr. Lacinda Axon

## 2020-08-18 ENCOUNTER — Other Ambulatory Visit: Payer: Self-pay | Admitting: Internal Medicine

## 2020-08-18 DIAGNOSIS — Z1231 Encounter for screening mammogram for malignant neoplasm of breast: Secondary | ICD-10-CM

## 2020-09-23 ENCOUNTER — Ambulatory Visit
Admission: RE | Admit: 2020-09-23 | Discharge: 2020-09-23 | Disposition: A | Discharge status: Home / Self Care | Payer: Medicare Other | Source: Ambulatory Visit | Attending: Internal Medicine | Admitting: Internal Medicine

## 2020-09-23 ENCOUNTER — Other Ambulatory Visit: Payer: Self-pay

## 2020-09-23 DIAGNOSIS — Z1231 Encounter for screening mammogram for malignant neoplasm of breast: Secondary | ICD-10-CM | POA: Diagnosis not present

## 2020-12-30 ENCOUNTER — Other Ambulatory Visit: Payer: Self-pay | Admitting: Physician Assistant

## 2020-12-30 ENCOUNTER — Other Ambulatory Visit (HOSPITAL_COMMUNITY): Payer: Self-pay | Admitting: Physician Assistant

## 2020-12-30 DIAGNOSIS — R7989 Other specified abnormal findings of blood chemistry: Secondary | ICD-10-CM

## 2020-12-30 DIAGNOSIS — R112 Nausea with vomiting, unspecified: Secondary | ICD-10-CM

## 2020-12-30 DIAGNOSIS — R109 Unspecified abdominal pain: Secondary | ICD-10-CM

## 2021-01-06 ENCOUNTER — Ambulatory Visit
Admission: RE | Admit: 2021-01-06 | Discharge: 2021-01-06 | Disposition: A | Discharge status: Home / Self Care | Payer: Medicare Other | Source: Ambulatory Visit | Attending: Physician Assistant | Admitting: Physician Assistant

## 2021-01-06 ENCOUNTER — Other Ambulatory Visit: Payer: Self-pay

## 2021-01-06 ENCOUNTER — Ambulatory Visit: Payer: Medicare Other

## 2021-01-06 DIAGNOSIS — R7989 Other specified abnormal findings of blood chemistry: Secondary | ICD-10-CM | POA: Insufficient documentation

## 2021-01-06 DIAGNOSIS — R109 Unspecified abdominal pain: Secondary | ICD-10-CM | POA: Insufficient documentation

## 2021-01-06 DIAGNOSIS — R112 Nausea with vomiting, unspecified: Secondary | ICD-10-CM | POA: Insufficient documentation

## 2021-08-06 ENCOUNTER — Other Ambulatory Visit: Payer: Self-pay | Admitting: Student

## 2021-08-06 DIAGNOSIS — M7581 Other shoulder lesions, right shoulder: Secondary | ICD-10-CM

## 2021-08-06 DIAGNOSIS — S46011A Strain of muscle(s) and tendon(s) of the rotator cuff of right shoulder, initial encounter: Secondary | ICD-10-CM

## 2021-08-06 DIAGNOSIS — M7501 Adhesive capsulitis of right shoulder: Secondary | ICD-10-CM

## 2021-08-17 ENCOUNTER — Ambulatory Visit
Admission: RE | Admit: 2021-08-17 | Discharge: 2021-08-17 | Disposition: A | Discharge status: Home / Self Care | Payer: Medicare Other | Source: Ambulatory Visit | Attending: Student | Admitting: Student

## 2021-08-17 ENCOUNTER — Other Ambulatory Visit: Payer: Self-pay

## 2021-08-17 DIAGNOSIS — M7501 Adhesive capsulitis of right shoulder: Secondary | ICD-10-CM | POA: Insufficient documentation

## 2021-08-17 DIAGNOSIS — S46011A Strain of muscle(s) and tendon(s) of the rotator cuff of right shoulder, initial encounter: Secondary | ICD-10-CM | POA: Diagnosis present

## 2021-08-17 DIAGNOSIS — M7581 Other shoulder lesions, right shoulder: Secondary | ICD-10-CM

## 2021-09-09 ENCOUNTER — Other Ambulatory Visit: Payer: Self-pay | Admitting: Surgery

## 2021-09-17 ENCOUNTER — Other Ambulatory Visit: Payer: Self-pay

## 2021-09-17 ENCOUNTER — Other Ambulatory Visit
Admission: RE | Admit: 2021-09-17 | Discharge: 2021-09-17 | Disposition: A | Discharge status: Home / Self Care | Payer: Medicare Other | Source: Ambulatory Visit | Attending: Surgery | Admitting: Surgery

## 2021-09-17 DIAGNOSIS — Z881 Allergy status to other antibiotic agents status: Secondary | ICD-10-CM | POA: Diagnosis not present

## 2021-09-17 DIAGNOSIS — Z01818 Encounter for other preprocedural examination: Secondary | ICD-10-CM | POA: Diagnosis not present

## 2021-09-17 DIAGNOSIS — X58XXXA Exposure to other specified factors, initial encounter: Secondary | ICD-10-CM | POA: Insufficient documentation

## 2021-09-17 DIAGNOSIS — S83242A Other tear of medial meniscus, current injury, left knee, initial encounter: Secondary | ICD-10-CM | POA: Insufficient documentation

## 2021-09-17 HISTORY — DX: Unspecified osteoarthritis, unspecified site: M19.90

## 2021-09-17 LAB — COMPREHENSIVE METABOLIC PANEL
ALT: 37 U/L (ref 0–44)
AST: 37 U/L (ref 15–41)
Albumin: 4 g/dL (ref 3.5–5.0)
Alkaline Phosphatase: 113 U/L (ref 38–126)
Anion gap: 7 (ref 5–15)
BUN: 11 mg/dL (ref 8–23)
CO2: 29 mmol/L (ref 22–32)
Calcium: 9.1 mg/dL (ref 8.9–10.3)
Chloride: 104 mmol/L (ref 98–111)
Creatinine, Ser: 0.67 mg/dL (ref 0.44–1.00)
GFR, Estimated: >60 mL/min (ref >60)
Glucose, Bld: 113 mg/dL — ABNORMAL HIGH (ref 70–99)
Potassium: 3.6 mmol/L (ref 3.5–5.1)
Sodium: 140 mmol/L (ref 135–145)
Total Bilirubin: 1.4 mg/dL — ABNORMAL HIGH (ref 0.3–1.2)
Total Protein: 7.2 g/dL (ref 6.5–8.1)

## 2021-09-17 LAB — CBC WITH DIFFERENTIAL/PLATELET
Abs Immature Granulocytes: 0.02 10*3/uL (ref 0.00–0.07)
Basophils Absolute: 0 10*3/uL (ref 0.0–0.1)
Basophils Relative: 0 %
Eosinophils Absolute: 0 10*3/uL (ref 0.0–0.5)
Eosinophils Relative: 0 %
HCT: 40.1 % (ref 36.0–46.0)
Hemoglobin: 13.2 g/dL (ref 12.0–15.0)
Immature Granulocytes: 0 %
Lymphocytes Relative: 16 %
Lymphs Abs: 1.3 10*3/uL (ref 0.7–4.0)
MCH: 29.7 pg (ref 26.0–34.0)
MCHC: 32.9 g/dL (ref 30.0–36.0)
MCV: 90.1 fL (ref 80.0–100.0)
Monocytes Absolute: 0.5 10*3/uL (ref 0.1–1.0)
Monocytes Relative: 6 %
Neutro Abs: 6.2 10*3/uL (ref 1.7–7.7)
Neutrophils Relative %: 78 %
Platelets: 315 10*3/uL (ref 150–400)
RBC: 4.45 MIL/uL (ref 3.87–5.11)
RDW: 13.2 % (ref 11.5–15.5)
WBC: 8 10*3/uL (ref 4.0–10.5)
nRBC: 0 % (ref 0.0–0.2)

## 2021-09-17 LAB — URINALYSIS, ROUTINE W REFLEX MICROSCOPIC
Bilirubin Urine: NEGATIVE
Glucose, UA: NEGATIVE mg/dL
Hgb urine dipstick: NEGATIVE
Ketones, ur: 5 mg/dL — AB
Leukocytes,Ua: NEGATIVE
Nitrite: NEGATIVE
Protein, ur: NEGATIVE mg/dL
Specific Gravity, Urine: 1.019 (ref 1.005–1.030)
pH: 7 (ref 5.0–8.0)

## 2021-09-17 LAB — SURGICAL PCR SCREEN
MRSA, PCR: NEGATIVE
Staphylococcus aureus: NEGATIVE

## 2021-09-17 LAB — TYPE AND SCREEN
ABO/RH(D): O POS
Antibody Screen: NEGATIVE

## 2021-09-17 NOTE — Progress Notes (Signed)
  Perioperative Services Pre-Admission/Anesthesia Testing     Date: 09/17/21  Name: April Crawford MRN:   [id number]  Re: Change in ABX for upcoming surgery   Case: [id number] Date/Time: 09/28/21 1021   Procedure: REVERSE SHOULDER ARTHROPLASTY (Right: Shoulder)   Anesthesia type: Choice   Pre-op diagnosis: Tear of medial meniscus of left knee, current, unspecified tear type, initial encounter S83.242A   Location: [facility]   Surgeons: Corky Mull, MD   Primary attending surgeon was consulted regarding consideration of therapeutic change in antimicrobial agent being used for preoperative prophylaxis in this patient's upcoming surgical case. Following analysis of the risk versus benefits, the patient's primary attending surgeon advised that it would be acceptable to discontinue the ordered clindamycin and place an order for cefazolin 2 gm IV on call to the OR. Orders for this patient were amended by me following collaborative conversation with attending surgeon taking into consideration of risk versus benefits associated with the change in therapy.  Honor Loh, MSN, APRN, FNP-C, CEN [facility]  Peri-operative Services Nurse Practitioner Phone: [phone] 09/17/21 3:04 PM

## 2021-09-17 NOTE — Patient Instructions (Addendum)
Your procedure is scheduled on: 09/28/21- Tuesday Report to the Registration Desk on the 1st floor of the [facility]. To find out your arrival time, please call [phone] between 1PM - 3PM on: 09/27/21 - Monday Covid Test - 09/24/21 at the [facility] between 8 am and 1200.  REMEMBER: Instructions that are not followed completely may result in serious medical risk, up to and including death; or upon the discretion of your surgeon and anesthesiologist your surgery may need to be rescheduled.  Do not eat food after midnight the night before surgery.  No gum chewing, lozengers or hard candies.  You may however, drink CLEAR liquids up to 2 hours before you are scheduled to arrive for your surgery. Do not drink anything within 2 hours of your scheduled arrival time.  Clear liquids include: - water  - apple juice without pulp - gatorade (not RED, PURPLE, OR BLUE) - black coffee or tea (Do NOT add milk or creamers to the coffee or tea) Do NOT drink anything that is not on this list.  In addition, your doctor has ordered for you to drink the provided  Ensure Pre-Surgery Clear Carbohydrate Drink  Drinking this carbohydrate drink up to two hours before surgery helps to reduce insulin resistance and improve patient outcomes. Please complete drinking 2 hours prior to scheduled arrival time.  TAKE THESE MEDICATIONS THE MORNING OF SURGERY WITH A SIP OF WATER: - omeprazole (PRILOSEC OTC) 20 MG tablet  One week prior to surgery: Stop Anti-inflammatories (NSAIDS) such as Advil, Aleve, Ibuprofen, Motrin, Naproxen, Naprosyn and Aspirin based products such as Excedrin, Goodys Powder, BC Powder.  Stop ANY OVER THE COUNTER supplements until after surgery. 09/20/21.  You may however, continue to take Tylenol if needed for pain up until the day of surgery.  No Alcohol for 24 hours before or after surgery.  No Smoking including e-cigarettes for 24 hours prior to surgery.  No chewable  tobacco products for at least 6 hours prior to surgery.  No nicotine patches on the day of surgery.  Do not use any "recreational" drugs for at least a week prior to your surgery.  Please be advised that the combination of cocaine and anesthesia may have negative outcomes, up to and including death. If you test positive for cocaine, your surgery will be cancelled.  On the morning of surgery brush your teeth with toothpaste and water, you may rinse your mouth with mouthwash if you wish. Do not swallow any toothpaste or mouthwash.  Use CHG Soap or wipes as directed on instruction sheet.  Do not wear jewelry, make-up, hairpins, clips or nail polish.  Do not wear lotions, powders, or perfumes.   Do not shave body from the neck down 48 hours prior to surgery just in case you cut yourself which could leave a site for infection.  Also, freshly shaved skin may become irritated if using the CHG soap.  Contact lenses, hearing aids and dentures may not be worn into surgery.  Do not bring valuables to the hospital. [facility] is not responsible for any missing/lost belongings or valuables.   Notify your doctor if there is any change in your medical condition (cold, fever, infection).  Wear comfortable clothing (specific to your surgery type) to the hospital.  After surgery, you can help prevent lung complications by doing breathing exercises.  Take deep breaths and cough every 1-2 hours. Your doctor may order a device called an Incentive Spirometer to help you take deep breaths.  When coughing or sneezing, hold a pillow firmly against your incision with both hands. This is called "splinting." Doing this helps protect your incision. It also decreases belly discomfort.  If you are being admitted to the hospital overnight, leave your suitcase in the car. After surgery it may be brought to your room.  If you are being discharged the day of surgery, you will not be allowed to drive home. You will  need a responsible adult (18 years or older) to drive you home and stay with you that night.   If you are taking public transportation, you will need to have a responsible adult (18 years or older) with you. Please confirm with your physician that it is acceptable to use public transportation.   Please call the [facility] at [phone] if you have any questions about these instructions.  Surgery Visitation Policy:  Patients undergoing a surgery or procedure may have one family member or support person with them as long as that person is not COVID-19 positive or experiencing its symptoms.  That person may remain in the waiting area during the procedure and may rotate out with other people.  Inpatient Visitation:    Visiting hours are 7 a.m. to 8 p.m. Up to two visitors ages 16+ are allowed at one time in a patient room. The visitors may rotate out with other people during the day. Visitors must check out when they leave, or other visitors will not be allowed. One designated support person may remain overnight. The visitor must pass COVID-19 screenings, use hand sanitizer when entering and exiting the patient's room and wear a mask at all times, including in the patient's room. Patients must also wear a mask when staff or their visitor are in the room. Masking is required regardless of vaccination status.

## 2021-09-17 NOTE — Progress Notes (Signed)
  Perioperative Services Pre-Admission/Anesthesia Testing   Date: 09/17/21 Name: April Crawford MRN:   [id number]  Re: Consideration of preoperative prophylactic antibiotic change   Request sent to: Poggi, Marshall Cork, MD (routed and/or faxed via [facility])  Planned Surgical Procedure(s):    Case: [id number] Date/Time: 09/28/21 1021   Procedure: REVERSE SHOULDER ARTHROPLASTY (Right: Shoulder)   Anesthesia type: Choice   Pre-op diagnosis: Tear of medial meniscus of left knee, current, unspecified tear type, initial encounter S83.242A   Location: [facility] [place] [facility]   Surgeons: Corky Mull, MD   Clinical Notes:  Patient has a documented allergy to AUGMENTIN ONLY  The actual reaction to this medication is unknown  Screened as appropriate for cephalosporin use during medication reconciliation No immediate angioedema, dysphagia, SOB, anaphylaxis symptoms. No severe rash involving mucous membranes or skin necrosis. No hospital admissions related to side effects of PCN/cephalosporin use.  No documented reaction to PCN or cephalosporin in the last 10 years.  Request:  As an evidence based approach to reducing the rate of incidence for post-operative SSI and the development of MDROs, could an agent with narrower coverage for preoperative prophylaxis in this patient's upcoming surgical course be considered?   Currently ordered preoperative prophylactic ABX: clindamycin.   Specifically requesting change to cephalosporin (CEFAZOLIN).   Please communicate decision with me and I will change the orders in Epic as per your direction.    Honor Loh, MSN, APRN, FNP-C, CEN [facility]  Peri-operative Services Nurse Practitioner FAX: [phone] 09/17/21 11:52 AM

## 2021-09-20 ENCOUNTER — Other Ambulatory Visit: Payer: Medicare Other

## 2021-09-24 ENCOUNTER — Other Ambulatory Visit
Admission: RE | Admit: 2021-09-24 | Discharge: 2021-09-24 | Disposition: A | Discharge status: Home / Self Care | Payer: Medicare Other | Source: Ambulatory Visit | Attending: Surgery | Admitting: Surgery

## 2021-09-24 ENCOUNTER — Other Ambulatory Visit: Payer: Self-pay

## 2021-09-24 DIAGNOSIS — Z20822 Contact with and (suspected) exposure to covid-19: Secondary | ICD-10-CM | POA: Diagnosis not present

## 2021-09-24 DIAGNOSIS — Z01812 Encounter for preprocedural laboratory examination: Secondary | ICD-10-CM | POA: Insufficient documentation

## 2021-09-25 LAB — SARS CORONAVIRUS 2 (TAT 6-24 HRS): SARS Coronavirus 2: NEGATIVE

## 2021-09-27 MED ORDER — CHLORHEXIDINE GLUCONATE 0.12 % MT SOLN
15.0000 mL | Freq: Once | OROMUCOSAL | Status: AC
  Administered 2021-09-28: 15 mL via OROMUCOSAL

## 2021-09-27 MED ORDER — LACTATED RINGERS IV SOLN: INTRAVENOUS | Status: DC

## 2021-09-27 MED ORDER — CEFAZOLIN SODIUM-DEXTROSE 2-4 GM/100ML-% IV SOLN
2.0000 g | Freq: Once | INTRAVENOUS | Status: AC
  Administered 2021-09-28: 2 g via INTRAVENOUS

## 2021-09-27 MED ORDER — APREPITANT 40 MG PO CAPS
40.0000 mg | ORAL_CAPSULE | Freq: Once | ORAL | Status: AC
  Administered 2021-09-28: 40 mg via ORAL

## 2021-09-27 MED ORDER — ORAL CARE MOUTH RINSE: 15.0000 mL | Freq: Once | OROMUCOSAL | Status: AC

## 2021-09-28 ENCOUNTER — Ambulatory Visit: Payer: Medicare Other | Admitting: Urgent Care

## 2021-09-28 ENCOUNTER — Encounter: Payer: Self-pay | Admitting: Surgery

## 2021-09-28 ENCOUNTER — Other Ambulatory Visit: Payer: Self-pay

## 2021-09-28 ENCOUNTER — Observation Stay: Payer: Medicare Other

## 2021-09-28 ENCOUNTER — Observation Stay
Admission: RE | Admit: 2021-09-28 | Discharge: 2021-09-29 | Disposition: A | Discharge status: Home / Self Care | Payer: Medicare Other | Attending: Surgery | Admitting: Surgery

## 2021-09-28 ENCOUNTER — Ambulatory Visit: Payer: Medicare Other

## 2021-09-28 ENCOUNTER — Encounter
Admission: RE | Disposition: A | Discharge status: Home / Self Care | Payer: Self-pay | Source: Home / Self Care | Attending: Surgery

## 2021-09-28 DIAGNOSIS — W19XXXS Unspecified fall, sequela: Secondary | ICD-10-CM | POA: Diagnosis not present

## 2021-09-28 DIAGNOSIS — Z419 Encounter for procedure for purposes other than remedying health state, unspecified: Secondary | ICD-10-CM

## 2021-09-28 DIAGNOSIS — Z96611 Presence of right artificial shoulder joint: Secondary | ICD-10-CM

## 2021-09-28 DIAGNOSIS — M12511 Traumatic arthropathy, right shoulder: Secondary | ICD-10-CM | POA: Diagnosis not present

## 2021-09-28 DIAGNOSIS — Z9189 Other specified personal risk factors, not elsewhere classified: Secondary | ICD-10-CM | POA: Diagnosis not present

## 2021-09-28 DIAGNOSIS — S46011A Strain of muscle(s) and tendon(s) of the rotator cuff of right shoulder, initial encounter: Secondary | ICD-10-CM | POA: Diagnosis present

## 2021-09-28 HISTORY — PX: REVERSE SHOULDER ARTHROPLASTY: SHX5054

## 2021-09-28 SURGERY — ARTHROPLASTY, SHOULDER, TOTAL, REVERSE
Anesthesia: Regional | Site: Shoulder | Laterality: Right

## 2021-09-28 MED ORDER — HYDROCODONE-ACETAMINOPHEN 5-325 MG PO TABS: 1.0000 | ORAL_TABLET | ORAL | Status: DC | PRN

## 2021-09-28 MED ORDER — LIDOCAINE HCL (PF) 2 % IJ SOLN
INTRAMUSCULAR | Status: AC
  Filled 2021-09-28: qty 5

## 2021-09-28 MED ORDER — PANTOPRAZOLE SODIUM 40 MG PO TBEC
40.0000 mg | DELAYED_RELEASE_TABLET | Freq: Every day | ORAL | Status: DC
  Administered 2021-09-29: 40 mg via ORAL
  Filled 2021-09-28: qty 1

## 2021-09-28 MED ORDER — CEFAZOLIN SODIUM-DEXTROSE 2-4 GM/100ML-% IV SOLN
2.0000 g | Freq: Four times a day (QID) | INTRAVENOUS | Status: AC
  Administered 2021-09-28 – 2021-09-29 (×2): 2 g via INTRAVENOUS
  Filled 2021-09-28 (×3): qty 100

## 2021-09-28 MED ORDER — FENTANYL CITRATE (PF) 100 MCG/2ML IJ SOLN
INTRAMUSCULAR | Status: AC
  Filled 2021-09-28: qty 2

## 2021-09-28 MED ORDER — DEXMEDETOMIDINE (PRECEDEX) IN NS 20 MCG/5ML (4 MCG/ML) IV SYRINGE
PREFILLED_SYRINGE | INTRAVENOUS | Status: DC | PRN
  Administered 2021-09-28: 8 ug via INTRAVENOUS
  Administered 2021-09-28: 4 ug via INTRAVENOUS
  Administered 2021-09-28: 8 ug via INTRAVENOUS

## 2021-09-28 MED ORDER — GABAPENTIN 100 MG PO CAPS
100.0000 mg | ORAL_CAPSULE | Freq: Every day | ORAL | Status: DC
  Administered 2021-09-28: 100 mg via ORAL
  Filled 2021-09-28: qty 1

## 2021-09-28 MED ORDER — DEXAMETHASONE SODIUM PHOSPHATE 10 MG/ML IJ SOLN
INTRAMUSCULAR | Status: DC | PRN
  Administered 2021-09-28: 10 mg via INTRAVENOUS

## 2021-09-28 MED ORDER — CEFAZOLIN SODIUM-DEXTROSE 2-4 GM/100ML-% IV SOLN
INTRAVENOUS | Status: AC
  Filled 2021-09-28: qty 100

## 2021-09-28 MED ORDER — LACTATED RINGERS IV SOLN: INTRAVENOUS | Status: DC

## 2021-09-28 MED ORDER — MORPHINE SULFATE (PF) 2 MG/ML IV SOLN: 0.5000 mg | INTRAVENOUS | Status: DC | PRN

## 2021-09-28 MED ORDER — DEXAMETHASONE SODIUM PHOSPHATE 10 MG/ML IJ SOLN
INTRAMUSCULAR | Status: AC
  Filled 2021-09-28: qty 1

## 2021-09-28 MED ORDER — BUPIVACAINE LIPOSOME 1.3 % IJ SUSP
INTRAMUSCULAR | Status: AC
  Filled 2021-09-28: qty 20

## 2021-09-28 MED ORDER — OMEPRAZOLE MAGNESIUM 20 MG PO TBEC: 20.0000 mg | DELAYED_RELEASE_TABLET | Freq: Every day | ORAL | Status: DC

## 2021-09-28 MED ORDER — BUPIVACAINE-EPINEPHRINE (PF) 0.5% -1:200000 IJ SOLN
INTRAMUSCULAR | Status: DC | PRN
  Administered 2021-09-28: 30 mL

## 2021-09-28 MED ORDER — BUPIVACAINE LIPOSOME 1.3 % IJ SUSP
INTRAMUSCULAR | Status: AC
  Filled 2021-09-28: qty 10

## 2021-09-28 MED ORDER — ADULT MULTIVITAMIN W/MINERALS CH
1.0000 | ORAL_TABLET | Freq: Every day | ORAL | Status: DC
Start: 2021-09-29 — End: 2021-09-29
  Administered 2021-09-29: 1 via ORAL
  Filled 2021-09-28: qty 1

## 2021-09-28 MED ORDER — TRAMADOL HCL 50 MG PO TABS: 50.0000 mg | ORAL_TABLET | Freq: Four times a day (QID) | ORAL | Status: DC | PRN

## 2021-09-28 MED ORDER — KETOROLAC TROMETHAMINE 30 MG/ML IJ SOLN
INTRAMUSCULAR | Status: AC
  Filled 2021-09-28: qty 1

## 2021-09-28 MED ORDER — ROCURONIUM BROMIDE 10 MG/ML (PF) SYRINGE
PREFILLED_SYRINGE | INTRAVENOUS | Status: AC
  Filled 2021-09-28: qty 10

## 2021-09-28 MED ORDER — PROPOFOL 1000 MG/100ML IV EMUL
INTRAVENOUS | Status: AC
  Filled 2021-09-28: qty 100

## 2021-09-28 MED ORDER — BUPIVACAINE HCL (PF) 0.5 % IJ SOLN
INTRAMUSCULAR | Status: AC
  Filled 2021-09-28: qty 10

## 2021-09-28 MED ORDER — METOCLOPRAMIDE HCL 10 MG PO TABS: 5.0000 mg | ORAL_TABLET | Freq: Three times a day (TID) | ORAL | Status: DC | PRN

## 2021-09-28 MED ORDER — TRANEXAMIC ACID 1000 MG/10ML IV SOLN
INTRAVENOUS | Status: AC
  Filled 2021-09-28: qty 10

## 2021-09-28 MED ORDER — DOCUSATE SODIUM 100 MG PO CAPS
100.0000 mg | ORAL_CAPSULE | Freq: Two times a day (BID) | ORAL | Status: DC
  Administered 2021-09-28 – 2021-09-29 (×2): 100 mg via ORAL
  Filled 2021-09-28 (×2): qty 1

## 2021-09-28 MED ORDER — ROCURONIUM BROMIDE 100 MG/10ML IV SOLN
INTRAVENOUS | Status: DC | PRN
  Administered 2021-09-28: 50 mg via INTRAVENOUS
  Administered 2021-09-28: 20 mg via INTRAVENOUS

## 2021-09-28 MED ORDER — PROPOFOL 10 MG/ML IV BOLUS
INTRAVENOUS | Status: DC | PRN
  Administered 2021-09-28: 20 mg via INTRAVENOUS
  Administered 2021-09-28: 120 mg via INTRAVENOUS

## 2021-09-28 MED ORDER — CENTRUM SILVER PO TABS: ORAL_TABLET | Freq: Every day | ORAL | Status: DC

## 2021-09-28 MED ORDER — EPHEDRINE SULFATE 50 MG/ML IJ SOLN
INTRAMUSCULAR | Status: DC | PRN
  Administered 2021-09-28: 10 mg via INTRAVENOUS

## 2021-09-28 MED ORDER — MAGNESIUM HYDROXIDE 400 MG/5ML PO SUSP: 30.0000 mL | Freq: Every day | ORAL | Status: DC | PRN

## 2021-09-28 MED ORDER — METOCLOPRAMIDE HCL 5 MG/ML IJ SOLN: 5.0000 mg | Freq: Three times a day (TID) | INTRAMUSCULAR | Status: DC | PRN

## 2021-09-28 MED ORDER — PHENYLEPHRINE HCL-NACL 20-0.9 MG/250ML-% IV SOLN
INTRAVENOUS | Status: DC | PRN
  Administered 2021-09-28: 20 ug/min via INTRAVENOUS

## 2021-09-28 MED ORDER — ONDANSETRON HCL 4 MG PO TABS: 4.0000 mg | ORAL_TABLET | Freq: Four times a day (QID) | ORAL | Status: DC | PRN

## 2021-09-28 MED ORDER — OXYCODONE HCL 5 MG/5ML PO SOLN: 5.0000 mg | Freq: Once | ORAL | Status: DC | PRN

## 2021-09-28 MED ORDER — FENTANYL CITRATE PF 50 MCG/ML IJ SOSY
PREFILLED_SYRINGE | INTRAMUSCULAR | Status: AC
  Filled 2021-09-28: qty 1

## 2021-09-28 MED ORDER — BUPIVACAINE LIPOSOME 1.3 % IJ SUSP
INTRAMUSCULAR | Status: DC | PRN
  Administered 2021-09-28: 20 mL

## 2021-09-28 MED ORDER — BUPIVACAINE-EPINEPHRINE (PF) 0.5% -1:200000 IJ SOLN
INTRAMUSCULAR | Status: AC
  Filled 2021-09-28: qty 30

## 2021-09-28 MED ORDER — PRESERVISION AREDS 2+MULTI VIT PO CAPS: ORAL_CAPSULE | Freq: Every morning | ORAL | Status: DC

## 2021-09-28 MED ORDER — SODIUM CHLORIDE 0.9 % IR SOLN
Status: DC | PRN
  Administered 2021-09-28: 3000 mL

## 2021-09-28 MED ORDER — CHLORHEXIDINE GLUCONATE 0.12 % MT SOLN
OROMUCOSAL | Status: AC
  Filled 2021-09-28: qty 15

## 2021-09-28 MED ORDER — SCOPOLAMINE 1 MG/3DAYS TD PT72
1.0000 | MEDICATED_PATCH | TRANSDERMAL | Status: DC
  Administered 2021-09-28: 1.5 mg via TRANSDERMAL

## 2021-09-28 MED ORDER — FENTANYL CITRATE (PF) 100 MCG/2ML IJ SOLN: 25.0000 ug | INTRAMUSCULAR | Status: DC | PRN

## 2021-09-28 MED ORDER — LIDOCAINE HCL (CARDIAC) PF 100 MG/5ML IV SOSY
PREFILLED_SYRINGE | INTRAVENOUS | Status: DC | PRN
  Administered 2021-09-28: 60 mg via INTRAVENOUS

## 2021-09-28 MED ORDER — SODIUM CHLORIDE 0.9 % IV SOLN: INTRAVENOUS | Status: DC

## 2021-09-28 MED ORDER — STERILE WATER FOR IRRIGATION IR SOLN
Status: DC | PRN
  Administered 2021-09-28: 1000 mL

## 2021-09-28 MED ORDER — ACETAMINOPHEN 10 MG/ML IV SOLN
INTRAVENOUS | Status: DC | PRN
  Administered 2021-09-28: 1000 mg via INTRAVENOUS

## 2021-09-28 MED ORDER — ONDANSETRON HCL 4 MG/2ML IJ SOLN
4.0000 mg | Freq: Four times a day (QID) | INTRAMUSCULAR | Status: DC | PRN
  Filled 2021-09-28: qty 2

## 2021-09-28 MED ORDER — ACETAMINOPHEN 500 MG PO TABS
500.0000 mg | ORAL_TABLET | Freq: Four times a day (QID) | ORAL | Status: DC
  Administered 2021-09-28 – 2021-09-29 (×3): 500 mg via ORAL
  Filled 2021-09-28 (×3): qty 1

## 2021-09-28 MED ORDER — KETAMINE HCL 10 MG/ML IJ SOLN
INTRAMUSCULAR | Status: DC | PRN
  Administered 2021-09-28: 40 mg via INTRAVENOUS
  Administered 2021-09-28: 10 mg via INTRAVENOUS

## 2021-09-28 MED ORDER — FISH OIL 600 MG PO CAPS: 600.0000 mg | ORAL_CAPSULE | Freq: Every day | ORAL | Status: DC

## 2021-09-28 MED ORDER — BUPIVACAINE HCL (PF) 0.5 % IJ SOLN
INTRAMUSCULAR | Status: DC | PRN
  Administered 2021-09-28: 10 mL

## 2021-09-28 MED ORDER — KETOROLAC TROMETHAMINE 30 MG/ML IJ SOLN
INTRAMUSCULAR | Status: DC | PRN
  Administered 2021-09-28: 15 mg via INTRAVENOUS

## 2021-09-28 MED ORDER — MIDAZOLAM HCL 2 MG/2ML IJ SOLN: 1.0000 mg | INTRAMUSCULAR | Status: DC | PRN

## 2021-09-28 MED ORDER — OCUVITE-LUTEIN PO CAPS
1.0000 | ORAL_CAPSULE | Freq: Every day | ORAL | Status: DC
  Administered 2021-09-29: 1 via ORAL
  Filled 2021-09-28: qty 1

## 2021-09-28 MED ORDER — OMEGA-3-ACID ETHYL ESTERS 1 G PO CAPS
1.0000 g | ORAL_CAPSULE | Freq: Every day | ORAL | Status: DC
Start: 2021-09-29 — End: 2021-09-29
  Administered 2021-09-29: 1 g via ORAL
  Filled 2021-09-28: qty 1

## 2021-09-28 MED ORDER — TRANEXAMIC ACID 1000 MG/10ML IV SOLN
INTRAVENOUS | Status: DC | PRN
  Administered 2021-09-28: 1000 mg via INTRAVENOUS

## 2021-09-28 MED ORDER — DEXMEDETOMIDINE (PRECEDEX) IN NS 20 MCG/5ML (4 MCG/ML) IV SYRINGE
PREFILLED_SYRINGE | INTRAVENOUS | Status: AC
  Filled 2021-09-28: qty 5

## 2021-09-28 MED ORDER — ACETAMINOPHEN 10 MG/ML IV SOLN: 1000.0000 mg | Freq: Once | INTRAVENOUS | Status: DC | PRN

## 2021-09-28 MED ORDER — 0.9 % SODIUM CHLORIDE (POUR BTL) OPTIME
TOPICAL | Status: DC | PRN
  Administered 2021-09-28: 500 mL

## 2021-09-28 MED ORDER — PROPOFOL 500 MG/50ML IV EMUL
INTRAVENOUS | Status: DC | PRN
  Administered 2021-09-28: 100 ug/kg/min via INTRAVENOUS

## 2021-09-28 MED ORDER — OXYCODONE HCL 5 MG PO TABS: 5.0000 mg | ORAL_TABLET | Freq: Once | ORAL | Status: DC | PRN

## 2021-09-28 MED ORDER — LIDOCAINE HCL (PF) 1 % IJ SOLN
INTRAMUSCULAR | Status: AC
  Filled 2021-09-28: qty 5

## 2021-09-28 MED ORDER — ATORVASTATIN CALCIUM 20 MG PO TABS
40.0000 mg | ORAL_TABLET | Freq: Every day | ORAL | Status: DC
  Administered 2021-09-28: 40 mg via ORAL
  Filled 2021-09-28: qty 2

## 2021-09-28 MED ORDER — MIDAZOLAM HCL 2 MG/2ML IJ SOLN
INTRAMUSCULAR | Status: AC
  Administered 2021-09-28: 1 mg via INTRAVENOUS
  Filled 2021-09-28: qty 2

## 2021-09-28 MED ORDER — FENTANYL CITRATE PF 50 MCG/ML IJ SOSY: 50.0000 ug | PREFILLED_SYRINGE | Freq: Once | INTRAMUSCULAR | Status: AC

## 2021-09-28 MED ORDER — ACETAMINOPHEN 325 MG PO TABS: 325.0000 mg | ORAL_TABLET | Freq: Four times a day (QID) | ORAL | Status: DC | PRN

## 2021-09-28 MED ORDER — SCOPOLAMINE 1 MG/3DAYS TD PT72
MEDICATED_PATCH | TRANSDERMAL | Status: AC
  Filled 2021-09-28: qty 1

## 2021-09-28 MED ORDER — BISACODYL 10 MG RE SUPP: 10.0000 mg | Freq: Every day | RECTAL | Status: DC | PRN

## 2021-09-28 MED ORDER — ONDANSETRON HCL 4 MG/2ML IJ SOLN: 4.0000 mg | Freq: Once | INTRAMUSCULAR | Status: DC | PRN

## 2021-09-28 MED ORDER — ONDANSETRON HCL 4 MG/2ML IJ SOLN
INTRAMUSCULAR | Status: AC
  Filled 2021-09-28: qty 2

## 2021-09-28 MED ORDER — DIPHENHYDRAMINE HCL 12.5 MG/5ML PO ELIX: 12.5000 mg | ORAL_SOLUTION | ORAL | Status: DC | PRN

## 2021-09-28 MED ORDER — FLEET ENEMA 7-19 GM/118ML RE ENEM: 1.0000 | ENEMA | Freq: Once | RECTAL | Status: DC | PRN

## 2021-09-28 MED ORDER — SUGAMMADEX SODIUM 200 MG/2ML IV SOLN
INTRAVENOUS | Status: DC | PRN
  Administered 2021-09-28: 150 mg via INTRAVENOUS

## 2021-09-28 MED ORDER — ONDANSETRON HCL 4 MG/2ML IJ SOLN
INTRAMUSCULAR | Status: DC | PRN
  Administered 2021-09-28: 4 mg via INTRAVENOUS

## 2021-09-28 MED ORDER — ACETAMINOPHEN 10 MG/ML IV SOLN
INTRAVENOUS | Status: AC
  Filled 2021-09-28: qty 100

## 2021-09-28 MED ORDER — ENOXAPARIN SODIUM 40 MG/0.4ML IJ SOSY
40.0000 mg | PREFILLED_SYRINGE | INTRAMUSCULAR | Status: DC
  Administered 2021-09-29: 40 mg via SUBCUTANEOUS
  Filled 2021-09-28: qty 0.4

## 2021-09-28 MED ORDER — APREPITANT 40 MG PO CAPS
ORAL_CAPSULE | ORAL | Status: AC
  Filled 2021-09-28: qty 1

## 2021-09-28 MED ORDER — SODIUM CHLORIDE FLUSH 0.9 % IV SOLN
INTRAVENOUS | Status: AC
  Filled 2021-09-28: qty 20

## 2021-09-28 MED ORDER — PHENYLEPHRINE HCL (PRESSORS) 10 MG/ML IV SOLN
INTRAVENOUS | Status: DC | PRN
  Administered 2021-09-28: 240 ug via INTRAVENOUS
  Administered 2021-09-28: 160 ug via INTRAVENOUS

## 2021-09-28 MED ORDER — KETAMINE HCL 50 MG/5ML IJ SOSY
PREFILLED_SYRINGE | INTRAMUSCULAR | Status: AC
  Filled 2021-09-28: qty 5

## 2021-09-28 MED ORDER — FENTANYL CITRATE PF 50 MCG/ML IJ SOSY
PREFILLED_SYRINGE | INTRAMUSCULAR | Status: AC
  Administered 2021-09-28: 50 ug via INTRAVENOUS
  Filled 2021-09-28: qty 1

## 2021-09-28 SURGICAL SUPPLY — 67 items
BIT DRILL FLUTED 3.0 STRL (BIT) ×2 IMPLANT
BLADE SAW SAG 25X90X1.19 (BLADE) ×2 IMPLANT
BNDG COHESIVE 4X5 TAN ST LF (GAUZE/BANDAGES/DRESSINGS) ×2 IMPLANT
CHLORAPREP W/TINT 26 (MISCELLANEOUS) ×2 IMPLANT
COOLER POLAR GLACIER W/PUMP (MISCELLANEOUS) ×2 IMPLANT
COVER BACK TABLE REUSABLE LG (DRAPES) ×2 IMPLANT
CUP SUT UNIV REVERS 36 NEUTRAL (Cup) ×2 IMPLANT
DRAPE 3/4 80X56 (DRAPES) ×4 IMPLANT
DRAPE IMP U-DRAPE 54X76 (DRAPES) ×4 IMPLANT
DRAPE INCISE IOBAN 66X45 STRL (DRAPES) ×4 IMPLANT
DRSG OPSITE POSTOP 4X8 (GAUZE/BANDAGES/DRESSINGS) ×2 IMPLANT
ELECT BLADE 6.5 EXT (BLADE) IMPLANT
ELECT CAUTERY BLADE 6.4 (BLADE) ×2 IMPLANT
ELECT REM PT RETURN 9FT ADLT (ELECTROSURGICAL) ×2
ELECTRODE REM PT RTRN 9FT ADLT (ELECTROSURGICAL) ×1 IMPLANT
GAUZE XEROFORM 1X8 LF (GAUZE/BANDAGES/DRESSINGS) ×2 IMPLANT
GLENOID UNI REV MOD 24 +2 LAT (Joint) ×2 IMPLANT
GLENOSPHERE 36 +4 LAT/24 (Joint) ×2 IMPLANT
GLOVE SRG 8 PF TXTR STRL LF DI (GLOVE) ×2 IMPLANT
GLOVE SURG ENC MOIS LTX SZ7.5 (GLOVE) ×8 IMPLANT
GLOVE SURG ENC MOIS LTX SZ8 (GLOVE) ×8 IMPLANT
GLOVE SURG UNDER LTX SZ8 (GLOVE) ×2 IMPLANT
GLOVE SURG UNDER POLY LF SZ8 (GLOVE) ×2
GOWN STRL REUS W/ TWL LRG LVL3 (GOWN DISPOSABLE) ×1 IMPLANT
GOWN STRL REUS W/ TWL XL LVL3 (GOWN DISPOSABLE) ×1 IMPLANT
GOWN STRL REUS W/TWL LRG LVL3 (GOWN DISPOSABLE) ×1
GOWN STRL REUS W/TWL XL LVL3 (GOWN DISPOSABLE) ×1
ILLUMINATOR WAVEGUIDE N/F (MISCELLANEOUS) IMPLANT
IV NS IRRIG 3000ML ARTHROMATIC (IV SOLUTION) ×2 IMPLANT
KIT STABILIZATION SHOULDER (MISCELLANEOUS) ×2 IMPLANT
KIT TURNOVER KIT A (KITS) ×2 IMPLANT
LINER HUMERAL 36 +3MM SM (Shoulder) ×2 IMPLANT
MANIFOLD NEPTUNE II (INSTRUMENTS) ×2 IMPLANT
MASK FACE SPIDER DISP (MASK) ×2 IMPLANT
MAT ABSORB  FLUID 56X50 GRAY (MISCELLANEOUS) ×1
MAT ABSORB FLUID 56X50 GRAY (MISCELLANEOUS) ×1 IMPLANT
NDL SAFETY ECLIPSE 18X1.5 (NEEDLE) ×1 IMPLANT
NEEDLE HYPO 18GX1.5 SHARP (NEEDLE) ×1
NEEDLE HYPO 22GX1.5 SAFETY (NEEDLE) ×2 IMPLANT
NEEDLE SPNL 20GX3.5 QUINCKE YW (NEEDLE) ×2 IMPLANT
NS IRRIG 500ML POUR BTL (IV SOLUTION) ×2 IMPLANT
PACK ARTHROSCOPY SHOULDER (MISCELLANEOUS) ×2 IMPLANT
PAD ARMBOARD 7.5X6 YLW CONV (MISCELLANEOUS) ×2 IMPLANT
PAD WRAPON POLAR SHDR UNIV (MISCELLANEOUS) ×1 IMPLANT
PENCIL SMOKE EVACUATOR (MISCELLANEOUS) IMPLANT
PIN NITINOL TARGETER 2.8 (PIN) ×2 IMPLANT
PIN SET MODULAR GLENOID SYSTEM (PIN) ×2 IMPLANT
PULSAVAC PLUS IRRIG FAN TIP (DISPOSABLE) ×2
SCREW CENTRAL MODULAR 25 (Screw) ×2 IMPLANT
SCREW PERI LOCK 5.5X32 (Screw) ×2 IMPLANT
SCREW PERIPHERAL 5.5X20 LOCK (Screw) ×6 IMPLANT
SLING ULTRA II M (MISCELLANEOUS) IMPLANT
SPONGE T-LAP 18X18 ~~LOC~~+RFID (SPONGE) ×4 IMPLANT
STAPLER SKIN PROX 35W (STAPLE) ×2 IMPLANT
STEM HUM UNIV REV APEX SZ 6 (Stem) ×2 IMPLANT
SUT ETHIBOND 0 MO6 C/R (SUTURE) ×2 IMPLANT
SUT FIBERWIRE #2 38 BLUE 1/2 (SUTURE) ×6
SUT VIC AB 0 CT1 36 (SUTURE) ×2 IMPLANT
SUT VIC AB 2-0 CT1 27 (SUTURE) ×2
SUT VIC AB 2-0 CT1 TAPERPNT 27 (SUTURE) ×2 IMPLANT
SUTURE FIBERWR #2 38 BLUE 1/2 (SUTURE) ×3 IMPLANT
SYR 10ML LL (SYRINGE) ×2 IMPLANT
SYR 30ML LL (SYRINGE) ×2 IMPLANT
TIP FAN IRRIG PULSAVAC PLUS (DISPOSABLE) ×1 IMPLANT
TOWEL OR 17X26 4PK STRL BLUE (TOWEL DISPOSABLE) ×2 IMPLANT
WATER STERILE IRR 1000ML POUR (IV SOLUTION) ×2 IMPLANT
WRAPON POLAR PAD SHDR UNIV (MISCELLANEOUS) ×2

## 2021-09-28 NOTE — Anesthesia Preprocedure Evaluation (Addendum)
Anesthesia Evaluation  Patient identified by MRN, date of birth, ID band Patient awake    Reviewed: Allergy & Precautions, NPO status , Patient's Chart, lab work & pertinent test results  History of Anesthesia Complications (+) PONV and history of anesthetic complications  Airway Mallampati: IV   Neck ROM: Full    Dental   Missing few molars:   Pulmonary sleep apnea ,    Pulmonary exam normal breath sounds clear to auscultation       Cardiovascular Exercise Tolerance: Good negative cardio ROS Normal cardiovascular exam Rhythm:Regular Rate:Normal  ECG 09/17/21:  Normal sinus rhythm Left anterior fascicular block Moderate voltage criteria for LVH, may be normal variant ( R in aVL , Cornell product )   Neuro/Psych  Headaches, PSYCHIATRIC DISORDERS Depression Vertigo, HOH    GI/Hepatic GERD  ,  Endo/Other  Obesity, prediabetes  Renal/GU negative Renal ROS     Musculoskeletal  (+) Arthritis ,   Abdominal   Peds  Hematology Breast CA   Anesthesia Other Findings   Reproductive/Obstetrics                            Anesthesia Physical Anesthesia Plan  ASA: 2  Anesthesia Plan: General and Regional   Post-op Pain Management:    Induction: Intravenous  PONV Risk Score and Plan: 4 or greater and Ondansetron, Dexamethasone, Treatment may vary due to age or medical condition and Scopolamine patch - Pre-op  Airway Management Planned: Oral ETT  Additional Equipment:   Intra-op Plan:   Post-operative Plan: Extubation in OR  Informed Consent: I have reviewed the patients History and Physical, chart, labs and discussed the procedure including the risks, benefits and alternatives for the proposed anesthesia with the patient or authorized representative who has indicated his/her understanding and acceptance.     Dental advisory given  Plan Discussed with: CRNA  Anesthesia Plan Comments:  (Plan for preoperative interscalene nerve block and GETA.  Patient consented for risks of anesthesia including but not limited to:  - adverse reactions to medications - damage to eyes, teeth, lips or other oral mucosa - nerve damage due to positioning  - sore throat or hoarseness - shortness of breath, Horner's syndrome, nerve damage 2/2 nerve block - damage to heart, brain, nerves, lungs, other parts of body or loss of life  Informed patient about role of CRNA in peri- and intra-operative care.  Patient voiced understanding.)      Anesthesia Quick Evaluation

## 2021-09-28 NOTE — Plan of Care (Signed)

## 2021-09-28 NOTE — TOC Progression Note (Signed)
Transition of Care [facility]) - Progression Note    Patient Details  Name: April Crawford MRN: [id number] Date of Birth: 18-Jul-1946  Transition of Care [facility]) CM/SW [place], RN Phone Number: 09/28/2021, 3:49 PM  Clinical Narrative:  Patient set up with [facility] as per [place] for [facility] on discharge.          Expected Discharge Plan and Services                                                 Social Determinants of Health (SDOH) Interventions    Readmission Risk Interventions No flowsheet data found.

## 2021-09-28 NOTE — Anesthesia Procedure Notes (Signed)
Anesthesia Regional Block: Interscalene brachial plexus block   Pre-Anesthetic Checklist: , timeout performed,  Correct Patient, Correct Site, Correct Laterality,  Correct Procedure,, site marked,  Risks and benefits discussed,  Surgical consent,  Pre-op evaluation,  At surgeon's request and post-op pain management  Laterality: Right  Prep: chloraprep       Needles:  Injection technique: Single-shot  Needle Type: Echogenic Needle          Additional Needles:   Procedures:,,,, ultrasound used (permanent image in chart),,   Motor weakness within 20 minutes.  Narrative:  Start time: 09/28/2021 9:47 AM End time: 09/28/2021 9:49 AM Injection made incrementally with aspirations every 5 mL.  Performed by: Personally  Anesthesiologist: Darrin Nipper, MD  Additional Notes: Functioning IV was confirmed and monitors applied.  Sterile prep and drape, hand hygiene and sterile gloves were used. Ultrasound guidance: relevant anatomy identified, needle position confirmed, local anesthetic spread visualized around nerve(s), vascular puncture avoided.  Image printed for medical record.  Negative aspiration and negative test dose prior to incremental administration of local anesthetic for a total 10 ml bupivacaine 0.5% and 20 ml Exparel. The patient tolerated the procedure well. Vital signs and moderate sedation medications recorded in RN notes.

## 2021-09-28 NOTE — H&P (Signed)
History of Present Illness:  April Crawford is a 76 y.o. female who presents today as a result of a referral from Cameron Proud, [place], for right shoulder pain and weakness.   The patient's symptoms began several months ago and developed as a result of a fall. Apparently she lost her balance while trying to get back into bed in the middle the night after going to the bathroom. She saw Cameron Proud, PA-C, who sent her for an MRI scan and referred her to me for further evaluation and treatment. The patient describes the symptoms as marked (major pain with significant limitations) and have the quality of being aching, miserable, nagging, stabbing, tender and throbbing. The pain is localized to the lateral arm/shoulder. These symptoms are aggravated constantly, with normal daily activities, with sleeping, at higher levels of activity, with overhead activity and reaching behind the back. She has tried acetaminophen and narcotics with limited benefit. She has tried rest and ice with no significant benefit. She has not tried any physical therapy or steroid injections for the symptoms. She notes some radiation of pain into the right side of her neck, but denies any numbness or paresthesias down her arm to her hand. She denies any prior problems with her right shoulder. This complaint is not work related. She is a sports non-participant.  Shoulder Surgical History:  The patient has had no shoulder surgery in the past.  PMH/PSH/Family History/Social History/Meds/Allergies:  I have reviewed past medical, surgical, social and family history, medications and allergies as documented in the EMR.  Current Outpatient Medications:  acetaminophen (TYLENOL) 500 MG tablet Take 1,000 mg by mouth as needed for Pain   atorvastatin (LIPITOR) 40 MG tablet Take 1 tablet (40 mg total) by mouth nightly 90 tablet 3   calcium carbonate-vitamin D3 (CALTRATE 600+D) 600 mg(1,500mg ) -400 unit tablet Take 1 tablet by mouth once daily.    FOLIC ACID/MULTIVIT-MIN/LUTEIN (CENTRUM SILVER ORAL) Take by mouth once daily.   gabapentin (NEURONTIN) 100 MG capsule TAKE 1 CAPSULE BY MOUTH AT BEDTIME 90 capsule 1   magnesium oxide (MAG-OX) 400 mg (241.3 mg magnesium) tablet Take 400 mg by mouth once daily   multivitamin with minerals, EYE, (PRESERVISION AREDS 2) soft gel capsule Take 2 capsules by mouth once daily   ondansetron HCl (ZOFRAN ORAL) Take by mouth   pantoprazole (PROTONIX) 40 MG DR tablet TAKE 1 TABLET BY MOUTH ONCE DAILY 90 tablet 1   traMADoL (ULTRAM) 50 mg tablet Take 1 tablet (50 mg total) by mouth every 6 (six) hours as needed for Pain 30 tablet 0   venlafaxine (EFFEXOR-XR) 75 MG XR capsule TAKE 1 CAPSULE BY MOUTH ONCE DAILY 90 capsule 1   Allergies:   Amoxicillin Rash   Amoxicillin-Pot Clavulanate Rash (Patient cannot remember)   Past Medical History:   Adenomatous polyp of colon 01/01/1999   Allergic rhinitis   Breast cancer (CMS-HCC)   Diverticulosis 11/16/2006   History of colon polyps   Hyperplastic colon polyp 01/01/1999   Melanosis of colon 03/26/2012   Migraines   Tubular adenoma of colon 03/26/2012   Past Surgical History:   COLONOSCOPY 03/26/2012 (Tubular Adenoma. Repeat in 3 Years)   COLONOSCOPY 11/16/2006, 10/25/2001, 01/01/1999   COLONOSCOPY 03/12/2015 (Entire examined colon is normal/PHx of colon polyps/Repeat 65yrs/PYO)   COLONOSCOPY 07/10/2020 (Tubular adenoma/Melanosis coli/No Repeat due to age/TKT)   HYSTERECTOMY   MASTECTOMY PARTIAL / LUMPECTOMY   Family History:   Lung cancer Father   Social History:   Socioeconomic History:  Marital status: Married  Tobacco Use   Smoking status: Never Smoker   Smokeless tobacco: Never Used  Scientific laboratory technician Use: Never used  Substance and Sexual Activity   Alcohol use: No   Drug use: Never   Sexual activity: Defer   Review of Systems:  A comprehensive 14 point ROS was performed, reviewed, and the pertinent orthopaedic findings are documented in  the HPI.  Physical Exam:  Vitals:  08/30/21 1305  BP: 128/72  Weight: 83.6 kg (184 lb 3.2 oz)  Height: 152.4 cm (5')  PainSc: 4  PainLoc: Shoulder   General/Constitutional: Pleasant overweight elderly female in no acute distress. Neuro/Psych: Normal mood and affect, oriented to person, place and time. Eyes: Non-icteric. Pupils are equal, round, and reactive to light, and exhibit synchronous movement. ENT: Unremarkable. Lymphatic: No palpable adenopathy. Respiratory: Lungs clear to auscultation, Normal chest excursion, No wheezes and Non-labored breathing Cardiovascular: Regular rate and rhythm. No murmurs. and No edema, swelling or tenderness, except as noted in detailed exam. Integumentary: No impressive skin lesions present, except as noted in detailed exam. Musculoskeletal: Unremarkable, except as noted in detailed exam.  Right shoulder exam: SKIN: normal SWELLING: none WARMTH: none LYMPH NODES: no adenopathy palpable CREPITUS: none TENDERNESS: Mildly tender over anterolateral shoulder ROM (active):  Forward flexion: 90 degrees Abduction: 110 degrees Internal rotation: L3 ROM (passive):  Forward flexion: 125 degrees Abduction: 125 degrees  ER/IR at 90 abd: 80 degrees / 50 degrees  She has moderate pain at the extremes of all motions.  STRENGTH: Forward flexion: 3/5 Abduction: 3/5 External rotation: 4/5 Internal rotation: 4+/5 Pain with RC testing: Moderate pain with resisted forward flexion and abduction, and mild pain with resisted external rotation  STABILITY: Normal  SPECIAL TESTS: Luan Pulling' test: positive, mild Speed's test: Not evaluated Capsulitis - pain w/ passive ER: no Crossed arm test: Mildly positive Crank: Not evaluated Anterior apprehension: Negative Posterior apprehension: Not evaluated  She is neurovascularly intact to the right upper extremity.  Imaging:  Recent true AP, Y-scapular, and axillary views of the right shoulder are available for  review and have been reviewed by myself. These films demonstrate mild degenerative changes. The subacromial space is moderately decreased. There is no subacromial or infra-clavicular spurring. She demonstrates a Type II acromion.  Shoulder Imaging, MRI: Right Shoulder: MRI Shoulder Cartilage: Partial thickness humeral head cartilage loss. Partial thickness glenoid cartilage loss. Small glenohumeral effusion. MRI Shoulder Rotator Cuff: Full thickness tear of the supraspinatus. Retracted to the glenohumeral joint. Moderate to severe tendinopathy of infraspinatus and subscapularis tendons with bursal surface partial-thickness tearing of infraspinatus tendon. MRI Shoulder Labrum / Biceps: Biceps tendinopathy. MRI Shoulder Bone: Normal bone.  Both the films and report were reviewed by myself and discussed with the patient and her son.  Assessment:   Rotator cuff tendinitis, right.   Traumatic complete tear of right rotator cuff.   Primary osteoarthritis of right shoulder.   Plan:  The treatment options were discussed with the patient and her son. In addition, patient educational materials were provided regarding the diagnosis and treatment options. The patient is quite frustrated by her symptoms and functional limitations, and is ready to consider more aggressive treatment options. Therefore, I have recommended a surgical procedure, specifically a reverse right total shoulder arthroplasty. The procedure was discussed with the patient, as were the potential risks (including bleeding, infection, nerve and/or blood vessel injury, persistent or recurrent pain, loosening and/or failure of the components, dislocation, need for further surgery, blood clots, strokes,  heart attacks and/or arhythmias, pneumonia, etc.) and benefits. The patient states his/her understanding and wishes to proceed. All of the patient's questions and concerns were answered. She can call any time with further concerns. She will follow  up post-surgery, routine.   H&P reviewed and patient re-examined. No changes.

## 2021-09-28 NOTE — Op Note (Signed)
09/28/2021  1:04 PM  Patient:   April Crawford  Pre-Op Diagnosis:   Massive irreparable rotator cuff tear with early cuff arthropathy, right shoulder.  Post-Op Diagnosis:   Same  Procedure:   Reverse right total shoulder arthroplasty.  Surgeon:   Pascal Lux, MD  Assistant:   Cameron Proud, PA-C  Anesthesia:   General endotracheal with an interscalene block using Exparel placed preoperatively by the anesthesiologist.  Findings:   As above.  Complications:   None  EBL:   175 cc  Fluids:   600 cc crystalloid  UOP:   None  TT:   None  Drains:   None  Closure:   Staples  Implants:   All press-fit Arthrex system with a #6 Apex humeral stem, a 36 mm SutureCup with a +3 insert, and a 24 mm +2 mm lateralized base plate with a 36 mm  +4 mm lateralized glenosphere.  Brief Clinical Note:   The patient is 75 year old female with a history of progressive worsening pain and weakness of her right shoulder. Her symptoms have progressed despite medications, activity modification, etc. Her history and examination are consistent with a large rotator cuff tear. This was confirmed by MRI scan which also demonstrated early cuff arthropathy. The patient presents at this time for a reverse right total shoulder arthroplasty.  Procedure:   The patient underwent placement of an interscalene block using Exparel by the anesthesiologist in the preoperative holding area before being brought into the operating room and lain in the supine position. The patient then underwent general endotracheal intubation and anesthesia before the patient was repositioned in the beach chair position using the beach chair positioner. The right shoulder and upper extremity were prepped with ChloraPrep solution before being draped sterilely. Preoperative antibiotics were administered. A timeout was performed to verify the appropriate surgical site.    A standard anterior approach to the shoulder was made through an  approximately 4-5 inch incision. The incision was carried down through the subcutaneous tissues to expose the deltopectoral fascia. The interval between the deltoid and pectoralis muscles was identified and this plane developed, retracting the cephalic vein laterally with the deltoid muscle. The conjoined tendon was identified. Its lateral margin was dissected and the Kolbel self-retraining retractor inserted. The "three sisters" were identified and cauterized. Bursal tissues were removed to improve visualization. The subscapularis tendon was released from its attachment to the lesser tuberosity 1 cm proximal to its insertion and several tagging sutures placed. The inferior capsule was released with care after identifying and protecting the axillary nerve. The proximal humeral cut was made at approximately 30 of retroversion using the extra-medullary guide.   Attention was redirected to the glenoid. The labrum was debrided circumferentially before the center of the glenoid was marked with electrocautery. The guidewire was drilled into the glenoid neck using the appropriate guide. After verifying its position, it was overreamed with the baseplate reamer to create a flat surface before the peripheral reamer was introduced to clean off any peripheral soft tissues. The central 10 mm coring reamer was then inserted before the hole was tapped to complete the glenoid preparation. The permanent mini-baseplate construct with a 25 mm screw attachment was screwed into place and tightened securely. The baseplate was then further secured using four peripheral locking screws. The permanent 36 mm +4 mm lateralized glenosphere was then impacted into place and its Morse taper locking mechanism verified using manual distraction. Finally, the glenosphere locking screw was inserted and tightened securely.  Attention  was directed to the humeral side. The humeral canal was reamed with first the 5 mm and then the 6 mm reamer. The  canal was broached beginning with a #5 broach and progressing to a #6 broach. This was left in place and the metaphyseal inset reamer was used to create the metaphyseal socket.  A trial reduction performed using the 36 mm trial humeral platform with the +3 mm insert. The arm demonstrated excellent range of motion as the hand could be brought across the chest to the opposite shoulder and brought to the top of the patient's head and to the patient's ear. The shoulder appeared stable throughout this range of motion. The joint was dislocated and the trial components removed. The permanent #6 Apex stem was impacted into place with care taken to maintain the appropriate version. The permanent 36 mm SutureCup humeral platform was then impacted into place before the +3 mm insert was snapped into place. The shoulder was relocated using two finger pressure and again placed through a range of motion with the findings as described above.  The wound was copiously irrigated with sterile saline solution using the jet lavage system before a total of 30 cc of 0.5% Sensorcaine with epinephrine was injected into the pericapsular and peri-incisional tissues to help with postoperative analgesia. The subscapularis tendon was reapproximated using #2 FiberWire interrupted sutures. The deltopectoral interval was closed using #0 Vicryl interrupted sutures before the subcutaneous tissues were closed using 2-0 Vicryl interrupted sutures. The skin was closed using staples. Prior to closing the skin, 1 g of transexemic acid in 10 cc of normal saline was injected intra-articularly to help with postoperative bleeding. A sterile occlusive dressing was applied to the wound before the arm was placed into a shoulder immobilizer with an abduction pillow. A Polar Care system also was applied to the shoulder. The patient was then transferred back to a hospital bed before being awakened, extubated, and returned to the recovery room in satisfactory  condition after tolerating the procedure well.

## 2021-09-28 NOTE — Anesthesia Postprocedure Evaluation (Signed)
Anesthesia Post Note  Patient: April Crawford  Procedure(s) Performed: REVERSE SHOULDER ARTHROPLASTY (Right: Shoulder)  Patient location during evaluation: PACU Anesthesia Type: Regional Level of consciousness: awake and alert, awake and oriented Pain management: pain level controlled Vital Signs Assessment: post-procedure vital signs reviewed and stable Respiratory status: spontaneous breathing, nonlabored ventilation and respiratory function stable Cardiovascular status: blood pressure returned to baseline and stable Postop Assessment: no apparent nausea or vomiting Anesthetic complications: no   No notable events documented.   Last Vitals:  Vitals:   09/28/21 1330 09/28/21 1345  BP: (!) 124/50 (!) 131/52  Pulse: 73 77  Resp: 18 19  Temp:    SpO2: 93% 93%    Last Pain:  Vitals:   09/28/21 1345  TempSrc:   PainSc: 0-No pain                 Phill Mutter

## 2021-09-28 NOTE — Evaluation (Signed)
Physical Therapy Evaluation Patient Details Name: April Crawford MRN: [id number] DOB: 05/12/1946 Today's Date: 09/28/2021  History of Present Illness  Pt is a 75 yo F diagnosed with massive irreparable R rotator cuff tear with early cuff arthropathy and is s/p reverse R TSA. PMH includes: depression, GERD, arthritis, breast CA, sleep apnea, and diverticulosis.   Clinical Impression  Pt was pleasant and motivated to participate during the session and put forth good effort throughout. Pt required min assist for bed mobility for trunk control due to s/p R TSA. Pt's sitting balance EOB was good and was able to complete all there ex EOB and seated in recliner. Pt required min guard for STS for safety with QC. Pt tried SPC at EOB but decided QC was more steady. Pt required min guard for safety with steps forwards, backwards, and laterally with QC. Pt will benefit from HHPT upon discharge to safely address deficits listed in patient problem list for decreased caregiver assistance and eventual return to PLOF.   Recommendations for follow up therapy are one component of a multi-disciplinary discharge planning process, led by the attending physician.  Recommendations may be updated based on patient status, additional functional criteria and insurance authorization.  Follow Up Recommendations Home health PT    Assistance Recommended at Discharge Intermittent Supervision/Assistance  Functional Status Assessment Patient has had a recent decline in their functional status and demonstrates the ability to make significant improvements in function in a reasonable and predictable amount of time.  Equipment Recommendations  None recommended by PT    Recommendations for Other Services       Precautions / Restrictions Precautions Precautions: Shoulder Shoulder Interventions: Shoulder sling/immobilizer Precaution Booklet Issued: No Required Braces or Orthoses: Sling Restrictions Weight Bearing Restrictions:  Yes RUE Weight Bearing: Non weight bearing      Mobility  Bed Mobility Overal bed mobility: Needs Assistance Bed Mobility: Supine to Sit     Supine to sit: Min assist     General bed mobility comments: Min assist for trunk control, increased time and effort    Transfers Overall transfer level: Needs assistance Equipment used: Quad cane Transfers: Sit to/from Stand Sit to Stand: Min guard           General transfer comment: Min guard for safety, tried [facility] but pt reported feeling more steady with QC    Ambulation/Gait Ambulation/Gait assistance: Min guard Gait Distance (Feet): 6 Feet Assistive device: Quad cane Gait Pattern/deviations: Step-to pattern;Decreased step length - right;Decreased step length - left;Decreased stride length Gait velocity: decreased     General Gait Details: Increased time and effort, min guard for safety, forward/backward steps at EOB and lateral steps at [employer]  Stairs            Wheelchair Mobility    Modified Rankin (Stroke Patients Only)       Balance Overall balance assessment: Needs assistance Sitting-balance support: Single extremity supported;Feet supported Sitting balance-Leahy Scale: Good     Standing balance support: Single extremity supported;During functional activity Standing balance-Leahy Scale: Good Standing balance comment: with QC                             Pertinent Vitals/Pain Pain Assessment: 0-10 Pain Score: 3  Pain Location: R shoulder Pain Descriptors / Indicators: Aching;Sore Pain Intervention(s): Ice applied    Home Living Family/patient expects to be discharged to:: Private residence Living Arrangements: Children (Son) Available Help at Discharge: Family;Available 24 hours/day (  Son) Type of Home: House Home Access: Stairs to enter Entrance Stairs-Rails: Right Entrance Stairs-Number of Steps: 3   Home Layout: One level Home Equipment: Grab bars - tub/shower;Grab bars -  toilet;Shower seat;Wheelchair - manual      Prior Function Prior Level of Function : Independent/Modified Independent             Mobility Comments: Independent with mobility, no AD, 1 fall in the past 6 months ADLs Comments: Independent with ADLs     Hand Dominance        Extremity/Trunk Assessment   Upper Extremity Assessment Upper Extremity Assessment: RUE deficits/detail RUE Deficits / Details: s/p R TSA    Lower Extremity Assessment Lower Extremity Assessment: Overall WFL for tasks assessed       Communication   Communication: No difficulties  Cognition Arousal/Alertness: Awake/alert Behavior During Therapy: WFL for tasks assessed/performed Overall Cognitive Status: Within Functional Limits for tasks assessed                                          General Comments      Exercises Total Joint Exercises Ankle Circles/Pumps: AROM;Strengthening;Both;10 reps;Seated Quad Sets: AROM;Strengthening;Both;10 reps;Seated Gluteal Sets: AROM;Strengthening;Both;10 reps;Seated Towel Squeeze: AROM;Strengthening;Both;10 reps;Seated Knee Flexion: AROM;Strengthening;Both;10 reps;Seated Marching in Standing: AROM;Strengthening;Both;5 reps;Standing Other Exercises Other Exercises: Pt education on doing APs, QSs, and GSs x10 every 1-2 hours daily   Assessment/Plan    PT Assessment Patient needs continued PT services  PT Problem List Decreased strength;Decreased range of motion;Decreased balance;Decreased mobility;Pain;Decreased knowledge of use of DME       PT Treatment Interventions DME instruction;Functional mobility training;Therapeutic activities;Therapeutic exercise;Balance training;Patient/family education;Stair training;Gait training    PT Goals (Current goals can be found in the Care Plan section)  Acute Rehab PT Goals Patient Stated Goal: to go home PT Goal Formulation: With patient Time For Goal Achievement: 10/12/21 Potential to Achieve  Goals: Good    Frequency BID   Barriers to discharge        Co-evaluation               AM-PAC PT "6 Clicks" Mobility  Outcome Measure Help needed turning from your back to your side while in a flat bed without using bedrails?: A Little Help needed moving from lying on your back to sitting on the side of a flat bed without using bedrails?: A Little Help needed moving to and from a bed to a chair (including a wheelchair)?: A Little Help needed standing up from a chair using your arms (e.g., wheelchair or bedside chair)?: A Little Help needed to walk in hospital room?: A Little Help needed climbing 3-5 steps with a railing? : A Little 6 Click Score: 18    End of Session Equipment Utilized During Treatment: Gait belt Activity Tolerance: Patient tolerated treatment well Patient left: in chair;with call bell/phone within reach;with chair alarm set;with family/visitor present;Other (comment) (with ice reapplied) Nurse Communication: Mobility status PT Visit Diagnosis: Muscle weakness (generalized) (M62.81);Pain;History of falling (Z91.81);Difficulty in walking, not elsewhere classified (R26.2) Pain - Right/Left: Right Pain - part of body: Shoulder    Time: 9767-3419 PT Time Calculation (min) (ACUTE ONLY): 36 min   Charges:              Sheldon Silvan SPT 09/28/21, 5:30 PM

## 2021-09-28 NOTE — Anesthesia Procedure Notes (Addendum)
Procedure Name: Intubation Date/Time: 09/28/2021 10:39 AM Performed by: Debe Coder, CRNA Pre-anesthesia Checklist: Patient identified, Emergency Drugs available, Suction available and Patient being monitored Patient Re-evaluated:Patient Re-evaluated prior to induction Oxygen Delivery Method: Circle system utilized Preoxygenation: Pre-oxygenation with 100% oxygen Induction Type: IV induction Ventilation: Mask ventilation without difficulty Laryngoscope Size: McGraph and 3 Grade View: Grade II Tube type: Oral Tube size: 7.0 mm Number of attempts: 1 Airway Equipment and Method: Stylet and Oral airway Placement Confirmation: ETT inserted through vocal cords under direct vision, positive ETCO2 and breath sounds checked- equal and bilateral Secured at: 20 cm Tube secured with: Tape Dental Injury: Teeth and Oropharynx as per pre-operative assessment  Difficulty Due To: Difficult Airway- due to anterior larynx

## 2021-09-28 NOTE — Transfer of Care (Signed)
Immediate Anesthesia Transfer of Care Note  Patient: April Crawford  Procedure(s) Performed: REVERSE SHOULDER ARTHROPLASTY (Right: Shoulder)  Patient Location: PACU  Anesthesia Type:General  Level of Consciousness: awake, alert  and oriented  Airway & Oxygen Therapy: Patient Spontanous Breathing and Patient connected to face mask oxygen  Post-op Assessment: Report given to RN and Post -op Vital signs reviewed and stable  Post vital signs: Reviewed and stable  Last Vitals:  Vitals Value Taken Time  BP 116/47 09/28/21 1316  Temp 36.1 C 09/28/21 1311  Pulse 73 09/28/21 1316  Resp 19 09/28/21 1316  SpO2 100 % 09/28/21 1316  Vitals shown include unvalidated device data.  Last Pain:  Vitals:   09/28/21 1311  TempSrc:   PainSc: 0-No pain         Complications: No notable events documented.

## 2021-09-29 ENCOUNTER — Encounter: Payer: Self-pay | Admitting: Surgery

## 2021-09-29 DIAGNOSIS — S46011A Strain of muscle(s) and tendon(s) of the rotator cuff of right shoulder, initial encounter: Secondary | ICD-10-CM | POA: Diagnosis not present

## 2021-09-29 LAB — CBC
HCT: 37.5 % (ref 36.0–46.0)
Hemoglobin: 12.3 g/dL (ref 12.0–15.0)
MCH: 29.6 pg (ref 26.0–34.0)
MCHC: 32.8 g/dL (ref 30.0–36.0)
MCV: 90.4 fL (ref 80.0–100.0)
Platelets: 297 10*3/uL (ref 150–400)
RBC: 4.15 MIL/uL (ref 3.87–5.11)
RDW: 13.2 % (ref 11.5–15.5)
WBC: 14.5 10*3/uL — ABNORMAL HIGH (ref 4.0–10.5)
nRBC: 0 % (ref 0.0–0.2)

## 2021-09-29 LAB — BASIC METABOLIC PANEL
Anion gap: 4 — ABNORMAL LOW (ref 5–15)
BUN: 11 mg/dL (ref 8–23)
CO2: 29 mmol/L (ref 22–32)
Calcium: 8.9 mg/dL (ref 8.9–10.3)
Chloride: 106 mmol/L (ref 98–111)
Creatinine, Ser: 0.64 mg/dL (ref 0.44–1.00)
GFR, Estimated: >60 mL/min (ref >60)
Glucose, Bld: 155 mg/dL — ABNORMAL HIGH (ref 70–99)
Potassium: 4.2 mmol/L (ref 3.5–5.1)
Sodium: 139 mmol/L (ref 135–145)

## 2021-09-29 MED ORDER — TRAMADOL HCL 50 MG PO TABS: 50.0000 mg | ORAL_TABLET | Freq: Four times a day (QID) | ORAL | 0 refills | Status: AC | PRN

## 2021-09-29 MED ORDER — ASPIRIN EC 325 MG PO TBEC: 325.0000 mg | DELAYED_RELEASE_TABLET | Freq: Every day | ORAL | 0 refills | Status: AC

## 2021-09-29 MED ORDER — HYDROCODONE-ACETAMINOPHEN 5-325 MG PO TABS: 1.0000 | ORAL_TABLET | ORAL | 0 refills | Status: AC | PRN

## 2021-09-29 MED ORDER — ONDANSETRON HCL 4 MG PO TABS: 4.0000 mg | ORAL_TABLET | Freq: Four times a day (QID) | ORAL | 0 refills | Status: AC | PRN

## 2021-09-29 NOTE — Progress Notes (Signed)
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 PMH includes: depression, GERD, arthritis, breast CA, sleep apnea, and diverticulosis.   PT Comments    Pt was pleasant and motivated to participate during the session and put forth good effort throughout. Pt reported no pain in shoulder at the beginning of the session. Pt required min assist with bed mobility with trunk control getting from supine to sit. Pt reported feeling slightly nauseous once seated at EOB but was otherwise asymptomatic. SpO2 and HR were WNL. Pt required min guard for safety for STS and ambulation using QC that transitioned to [facility]. She demonstrated a slow but consistent and safe cadence. During a rest break, pt reported feeling like her vision was a little off, so vitals were taken again: SpO2 was 90, HR was 95, and BP was 135/53. She was asymptomatic otherwise. Pt was able to complete stair sequence with min guard for safety and min cuing for hand placement. Pt reported feeling normal and asymptomatic upon return to her room. Pt will benefit from HHPT upon discharge to safely address deficits listed in patient problem list for decreased caregiver assistance and eventual return to PLOF.   Recommendations for follow up therapy are one component of a multi-disciplinary discharge planning process, led by the attending physician.  Recommendations may be updated based on patient status, additional functional criteria and insurance authorization.  Follow Up Recommendations  Home health PT     Assistance Recommended at Discharge Intermittent Supervision/Assistance  Equipment Recommendations  None recommended by PT    Recommendations for Other Services       Precautions / Restrictions  Precautions Precautions: Shoulder Shoulder Interventions: Shoulder sling/immobilizer Precaution Booklet Issued: No Required Braces or Orthoses: Sling Restrictions Weight Bearing Restrictions: Yes RUE Weight Bearing: Non weight bearing     Mobility  Bed Mobility Overal bed mobility: Needs Assistance Bed Mobility: Supine to Sit     Supine to sit: Min assist     General bed mobility comments: Min assist for trunk control, increased time and effort    Transfers Overall transfer level: Needs assistance Equipment used: Quad cane Transfers: Sit to/from Stand Sit to Stand: Min guard           General transfer comment: Min guard for safety, able to transition to [facility] from QC    Ambulation/Gait Ambulation/Gait assistance: Min guard Gait Distance (Feet): 120 Feet x2 Assistive device: Quad cane;Straight cane Gait Pattern/deviations: Step-through pattern;Decreased step length - right;Decreased step length - left;Decreased stride length Gait velocity: decreased     General Gait Details: Increased time and effort, min guard for safety, ambulated to the rehab gym and back with one rest break, demonstrated slow but consistent, safe cadence   Stairs Stairs: Yes Stairs assistance: Min guard Stair Management: One rail Right;Sideways;Step to pattern Number of Stairs: 4 General stair comments: Min guard for safety but min cuing for hand placement   Wheelchair Mobility    Modified Rankin (Stroke Patients Only)       Balance Overall balance assessment: Needs assistance Sitting-balance support: Single extremity supported;Feet supported Sitting balance-Leahy Scale: Good     Standing balance support: Single extremity supported;During functional activity Standing balance-Leahy Scale: Good Standing balance comment: with QC transitioned to [facility]  Cognition Arousal/Alertness: Awake/alert Behavior During Therapy: WFL for tasks  assessed/performed Overall Cognitive Status: Within Functional Limits for tasks assessed                                          Exercises Other Exercises Other Exercises: Pt and family education on arm spatial awareness during functional activity Other Exercises: Pt and family education on car transfers for arm awareness    General Comments        Pertinent Vitals/Pain Pain Assessment: 0-10 Pain Score: 0-No pain Pain Intervention(s): Ice applied    Home Living                          Prior Function            PT Goals (current goals can now be found in the care plan section) Acute Rehab PT Goals Patient Stated Goal: to go home PT Goal Formulation: With patient Time For Goal Achievement: 10/12/21 Potential to Achieve Goals: Good Progress towards PT goals: Progressing toward goals    Frequency    BID      PT Plan Current plan remains appropriate    Co-evaluation              AM-PAC PT "6 Clicks" Mobility   Outcome Measure  Help needed turning from your back to your side while in a flat bed without using bedrails?: A Little Help needed moving from lying on your back to sitting on the side of a flat bed without using bedrails?: A Little Help needed moving to and from a bed to a chair (including a wheelchair)?: A Little Help needed standing up from a chair using your arms (e.g., wheelchair or bedside chair)?: A Little Help needed to walk in hospital room?: A Little Help needed climbing 3-5 steps with a railing? : A Little 6 Click Score: 18    End of Session Equipment Utilized During Treatment: Gait belt Activity Tolerance: Patient tolerated treatment well Patient left: in chair;with call bell/phone within reach;with chair alarm set;with family/visitor present;Other (comment) (with ice reapplied) Nurse Communication: Mobility status PT Visit Diagnosis: Muscle weakness (generalized) (M62.81);Pain;History of falling  (Z91.81);Difficulty in walking, not elsewhere classified (R26.2) Pain - Right/Left: Right Pain - part of body: Shoulder     Time: 6226-3335 PT Time Calculation (min) (ACUTE ONLY): 31 min  Charges:                        Sheldon Silvan SPT 09/29/21, 1:37 PM

## 2021-09-29 NOTE — TOC Initial Note (Signed)
Transition of Care [facility]) - Initial/Assessment Note    Patient Details  Name: April Crawford MRN: [id number] Date of Birth: October 18, 1946  [facility] CM/SW Contact:    Pete Pelt, RN Phone Number: 09/29/2021, 10:22 AM  Clinical Narrative:   Patient being discharged today with [facility].  No DME recommended by PT.  Patient lives alone, next door to son, who can assist if needed.  Son drives patient to appointments and pharmacy.  No concerns about taking medications as prescribed.  No concerns about discharging to home at this time.                 Expected Discharge Plan: [place] Barriers to Discharge: Barriers Resolved   Patient Goals and CMS Choice        Expected Discharge Plan and Services Expected Discharge Plan: [place]   Discharge Planning Services: CM Consult Post Acute Care Choice: [place] arrangements for the past 2 months: Single Family Home Expected Discharge Date: 09/29/21               DME Arranged:  (n/a)         HH Arranged: PT, OT HH Agency: [place] Date [place]: 09/29/21 Time [place]: 39 Representative spoke with at [place]: [place]  Prior Living Arrangements/Services Living arrangements for the past 2 months: [place] with:: Self Patient language and need for interpreter reviewed:: Yes (no interpreter required) Do you feel safe going back to the place where you live?: Yes      Need for Family Participation in Patient Care: Yes (Comment) Care giver support system in place?: Yes (comment)   Criminal Activity/Legal Involvement Pertinent to Current Situation/Hospitalization: No - Comment as needed  Activities of Daily Living Home Assistive Devices/Equipment: Eyeglasses, Hearing aid ADL Screening (condition at time of admission) Patient's cognitive ability adequate to safely complete daily activities?: Yes Is  the patient deaf or have difficulty hearing?: Yes Does the patient have difficulty seeing, even when wearing glasses/contacts?: No Does the patient have difficulty concentrating, remembering, or making decisions?: Yes Patient able to express need for assistance with ADLs?: Yes Does the patient have difficulty dressing or bathing?: No Independently performs ADLs?: Yes (appropriate for developmental age) Does the patient have difficulty walking or climbing stairs?: No Weakness of Legs: None Weakness of Arms/Hands: None  Permission Sought/Granted Permission sought to share information with : Case Manager Permission granted to share information with : Yes, Verbal Permission Granted     Permission granted to share info w AGENCY: [facility]        Emotional Assessment Appearance:: Appears stated age Attitude/Demeanor/Rapport: Gracious Affect (typically observed): Pleasant   Alcohol / Substance Use: Not Applicable Psych Involvement: No (comment)  Admission diagnosis:  Status post reverse arthroplasty of shoulder, right [Z96.611] Patient Active Problem List   Diagnosis Date Noted   Status post reverse arthroplasty of shoulder, right 09/28/2021   PCP:  Kirk Ruths, MD Pharmacy:   [facility], [place] [address] Phone: [phone] Fax: [phone]     Social Determinants of Health (SDOH) Interventions    Readmission Risk Interventions No flowsheet data found.

## 2021-09-29 NOTE — Discharge Instructions (Signed)
Diet: As you were doing prior to hospitalization   Shower:  May shower but keep the wounds dry, use an occlusive plastic wrap, NO SOAKING IN TUB.  If the bandage gets wet, change with a clean dry gauze.  Arm can come out of the sling to shower, keep arm at your side to shower.  Dressing:  Leave Honeycomb dressing intact to the right arm.  Activity:  Increase activity slowly as tolerated, but follow the weight bearing instructions below.  No lifting or driving for 6 weeks.  Weight Bearing:  Non-weightbearing to the right arm.  Blood Clot Prevention: Take 1 325mg  aspirin daily for blood clot prevention for 6 weeks.  To prevent constipation: you may use a stool softener such as -  Colace (over the counter) 100 mg by mouth twice a day  Drink plenty of fluids (prune juice may be helpful) and high fiber foods Miralax (over the counter) for constipation as needed.    Itching:  If you experience itching with your medications, try taking only a single pain pill, or even half a pain pill at a time.  You may take up to 10 pain pills per day, and you can also use benadryl over the counter for itching or also to help with sleep.   Precautions:  If you experience chest pain or shortness of breath - call 911 immediately for transfer to the hospital emergency department!!  If you develop a fever greater that 101 F, purulent drainage from wound, increased redness or drainage from wound, or calf pain-Call [place]                                               Follow- Up Appointment:  Please call for an appointment to be seen in 2 weeks at [facility]

## 2021-09-29 NOTE — Progress Notes (Signed)
  Subjective: 1 Day Post-Op Procedure(s) (LRB): REVERSE SHOULDER ARTHROPLASTY (Right) Patient reports pain as mild.   Patient is well, and has had no acute complaints or problems Plan is to go Home after hospital stay. Negative for chest pain and shortness of breath Fever: no Gastrointestinal:Negative for nausea and vomiting  Objective: Vital signs in last 24 hours: Temp:  [96.9 F (36.1 C)-98.7 F (37.1 C)] 98.7 F (37.1 C) (11/23 0846) Pulse Rate:  [65-82] 65 (11/23 0846) Resp:  [14-20] 18 (11/23 0846) BP: (114-147)/(44-72) 128/58 (11/23 0846) SpO2:  [89 %-100 %] 98 % (11/23 0846)  Intake/Output from previous day:  Intake/Output Summary (Last 24 hours) at 09/29/2021 1000 Last data filed at 09/29/2021 0447 Gross per 24 hour  Intake 1714.09 ml  Output 175 ml  Net 1539.09 ml    Intake/Output this shift: No intake/output data recorded.  Labs: Recent Labs    09/29/21 0240  HGB 12.3   Recent Labs    09/29/21 0240  WBC 14.5*  RBC 4.15  HCT 37.5  PLT 297   Recent Labs    09/29/21 0240  NA 139  K 4.2  CL 106  CO2 29  BUN 11  CREATININE 0.64  GLUCOSE 155*  CALCIUM 8.9   No results for input(s): LABPT, INR in the last 72 hours.  EXAM General - Patient is Alert, Appropriate, and Oriented Extremity - ABD soft Incision: dressing C/D/I Decreased sensation to light touch to the right arm. Able to flex and extend fingers without pain. Block still in effect to the right arm. Dressing/Incision - clean, dry, no drainage Motor Function - intact, moving foot and toes well on exam.  Abdomen soft with normal bowel sounds this AM.  Past Medical History:  Diagnosis Date   Arthritis    Breast cancer ([place]) 2002   radiaiton   Cancer [facility])    breast cancer   Depression    GERD (gastroesophageal reflux disease)    Headache    migraines 1 every 6 mos or year   HOH (hard of hearing)    uses hearing aids   Hx of colonic polyps    Personal history of radiation  therapy    PONV (postoperative nausea and vomiting)    not with colonoscopies   Sleep apnea     not using CPAP   Vertigo, benign positional    after been laying down    Assessment/Plan: 1 Day Post-Op Procedure(s) (LRB): REVERSE SHOULDER ARTHROPLASTY (Right) Principal Problem:   Status post reverse arthroplasty of shoulder, right  Estimated body mass index is 33.07 kg/m as calculated from the following:   Height as of this encounter: 5\' 1"  (1.549 m).   Weight as of this encounter: 79.4 kg. Advance diet Up with therapy D/C IV fluids when tolerating po intake.  Labs reviewed this AM. WBC 14.5 this AM, K+ 4.2 this morning. Patient is passing gas without pain this morning. Up with therapy today. Plan for possible d/c home this afternoon pending PT session today.  DVT Prophylaxis - Lovenox, Foot Pumps, and TED hose Non-weightbearing to the right arm.  Raquel Shondale Quinley, PA-C [facility] Orthopaedic Surgery 09/29/2021, 10:00 AM

## 2021-09-29 NOTE — Evaluation (Signed)
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 PMH includes: depression, GERD, arthritis, breast CA, sleep apnea, and diverticulosis.   Clinical Impression   Pt seen for OT evaluation this date in setting of acute hospitalization s/p elective R reverse TSA. Pt is INDEP and lives alone at baseline, but has support from her son while recovering. Currently, pt requires SETUP for g/h and eating, MOD A for bathing/dressing UB, MOD A for donning LB clothing (slip on shoes with LH shoehorn, pt can do with SETUP), MAX A for polar care and sling mgt which pt's son is educated on. OT educates pt and family re: polar care mgt, sling mgt, safety considerations, precautions, dependent shoulder movement for ADL modifications (UB bathing/dressing modifications), and elbow, wrist and hand exercises. Pt and family with good understanding and handout issued. Pt to d/c home today, recommend [place] f/u with patient.      Recommendations for follow up therapy are one component of a multi-disciplinary discharge planning process, led by the attending physician.  Recommendations may be updated based on patient status, additional functional criteria and insurance authorization.   Follow Up Recommendations  Home health OT    Assistance Recommended at Discharge Intermittent Supervision/Assistance  Functional Status Assessment  Patient has had a recent decline in their functional status and demonstrates the ability to make significant improvements in function in a reasonable and predictable amount of time.  Equipment Recommendations  Tub/shower seat    Recommendations for Other Services       Precautions / Restrictions Precautions Precautions: Shoulder Shoulder Interventions: Shoulder  sling/immobilizer Precaution Booklet Issued: No Required Braces or Orthoses: Sling Restrictions Weight Bearing Restrictions: Yes RUE Weight Bearing: Non weight bearing      Mobility Bed Mobility Overal bed mobility: Needs Assistance             General bed mobility comments: up to chair pre/post    Transfers Overall transfer level: Needs assistance Equipment used: 1 person hand held assist Transfers: Sit to/from Stand Sit to Stand: Min guard;Supervision           General transfer comment: CGA initial, SUPV on subsequent trials      Balance Overall balance assessment: Needs assistance Sitting-balance support: Single extremity supported;Feet supported Sitting balance-Leahy Scale: Good     Standing balance support: Single extremity supported;During functional activity Standing balance-Leahy Scale: Good                             ADL either performed or assessed with clinical judgement   ADL                                         General ADL Comments: requires SETUP for g/h and eating, MOD A for bathing/dressing UB, MOD A for donning LB clothing (slip on shoes with LH shoehorn, pt can do with SETUP), MAX A for polar care and sling mgt which pt's son is educated on.     Vision Baseline Vision/History: 1 Wears glasses Patient Visual Report: No change from baseline       Perception     Praxis  Pertinent Vitals/Pain Pain Assessment: 0-10 Pain Score: 2  Pain Location: R shoulder Pain Descriptors / Indicators: Aching Pain Intervention(s): Monitored during session;Ice applied     Hand Dominance     Extremity/Trunk Assessment Upper Extremity Assessment Upper Extremity Assessment: RUE deficits/detail;LUE deficits/detail RUE Deficits / Details: s/p R TSA RUE Sensation: WNL LUE Deficits / Details: WFL   Lower Extremity Assessment Lower Extremity Assessment: Defer to PT evaluation;Overall WFL for tasks assessed        Communication Communication Communication: No difficulties   Cognition Arousal/Alertness: Awake/alert Behavior During Therapy: WFL for tasks assessed/performed Overall Cognitive Status: Within Functional Limits for tasks assessed                                       General Comments       Exercises Other Exercises Other Exercises: OT ed with pt and family re: polar care mgt, sling mgt, UB bathign/dressing modifications, sleep considerations. Pt and family with good understanding, handout issued.   Shoulder Instructions      Home Living Family/patient expects to be discharged to:: Private residence Living Arrangements: Children Available Help at Discharge: Family;Available 24 hours/day (son) Type of Home: House Home Access: Stairs to enter [employer] of Steps: 3 Entrance Stairs-Rails: Right Home Layout: One level     Bathroom Shower/Tub: Occupational psychologist: Handicapped height     Home Equipment: Grab bars - tub/shower;Grab bars - toilet;Shower seat;Wheelchair - manual          Prior Functioning/Environment Prior Level of Function : Independent/Modified Independent             Mobility Comments: Independent with mobility, no AD, 1 fall in the past 6 months ADLs Comments: Independent with ADLs        OT Problem List: Decreased range of motion;Decreased activity tolerance      OT Treatment/Interventions: Self-care/ADL training;Therapeutic exercise;Therapeutic activities    OT Goals(Current goals can be found in the care plan section) Acute Rehab OT Goals Patient Stated Goal: to go home OT Goal Formulation: All assessment and education complete, DC therapy  OT Frequency:     Barriers to D/C:            Co-evaluation              AM-PAC OT "6 Clicks" Daily Activity     Outcome Measure Help from another person eating meals?: None Help from another person taking care of personal grooming?: A Little Help  from another person toileting, which includes using toliet, bedpan, or urinal?: A Lot Help from another person bathing (including washing, rinsing, drying)?: A Lot Help from another person to put on and taking off regular upper body clothing?: A Little Help from another person to put on and taking off regular lower body clothing?: A Lot 6 Click Score: 16   End of Session Equipment Utilized During Treatment: Gait belt Nurse Communication: Mobility status  Activity Tolerance: Patient tolerated treatment well Patient left: in chair;with call bell/phone within reach;with family/visitor present  OT Visit Diagnosis: Muscle weakness (generalized) (M62.81);Pain Pain - Right/Left: Right Pain - part of body: Shoulder                Time: 1132-1208 OT Time Calculation (min): 36 min Charges:  OT General Charges $OT Visit: 1 Visit OT Evaluation $OT Eval Moderate Complexity: 1 Mod OT Treatments $Self Care/Home Management : 8-22  mins $Therapeutic Activity: 8-22 mins  Gerrianne Scale, [place], OTR/L ascom [phone] 09/29/21, 5:54 PM

## 2021-09-29 NOTE — Discharge Summary (Signed)
Physician Discharge Summary  Patient ID: April Crawford MRN: [id number] DOB/AGE: [birth year] 75 y.o.  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]  Discharge Diagnoses: Patient Active Problem List   Diagnosis Date Noted   Status post reverse arthroplasty of shoulder, right 09/28/2021    Past Medical History:  Diagnosis Date   Arthritis    Breast cancer ([place]) 2002   radiaiton   Cancer [facility])    breast cancer   Depression    GERD (gastroesophageal reflux disease)    Headache    migraines 1 every 6 mos or year   HOH (hard of hearing)    uses hearing aids   Hx of colonic polyps    Personal history of radiation therapy    PONV (postoperative nausea and vomiting)    not with colonoscopies   Sleep apnea     not using CPAP   Vertigo, benign positional    after been laying down   Transfusion: None.   Consultants (if any):   Discharged Condition: Improved  Hospital Course: April Crawford is an 75 y.o. 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    Surgeries: Procedure(s): REVERSE SHOULDER ARTHROPLASTY on 09/28/2021 Patient tolerated the surgery well. Taken to PACU where she was stabilized and then transferred to the orthopedic floor.  Started on Lovenox 40mg  q 24 hrs. Foot pumps applied bilaterally at 80 mm. Heels elevated on bed with rolled towels. No evidence of DVT. Negative Homan. Physical therapy started on day #1 for gait training and transfer. OT started day #1 for ADL and assisted devices.  Patient's IV was removed on POD1.  Implants: All press-fit Arthrex system with a #6 Apex humeral stem, a 36 mm SutureCup with a +3 insert, and a 24 mm +2 mm lateralized base plate with a 36 mm  +4 mm  lateralized glenosphere.  She was given perioperative antibiotics:  Anti-infectives (From admission, onward)    Start     Dose/Rate Route Frequency Ordered Stop   09/28/21 1700  ceFAZolin (ANCEF) IVPB 2g/100 mL premix        2 g 200 mL/hr over 30 Minutes Intravenous Every 6 hours 09/28/21 1537 09/29/21 1059   09/28/21 0811  ceFAZolin (ANCEF) 2-4 GM/100ML-% IVPB       Note to Pharmacy: Maryagnes Amos   : cabinet override      09/28/21 0811 09/28/21 1054   09/27/21 2330  ceFAZolin (ANCEF) IVPB 2g/100 mL premix        2 g 200 mL/hr over 30 Minutes Intravenous  Once 09/27/21 2316 09/28/21 1042     .  She was given sequential compression devices, early ambulation, and Lovenox for DVT prophylaxis.  She benefited maximally from the hospital stay and there were no complications.    Recent vital signs:  Vitals:   09/29/21 0502 09/29/21 0846  BP: (!) 114/45 (!) 128/58  Pulse: 68 65  Resp: 20 18  Temp: 97.8 F (36.6 C) 98.7 F (37.1 C)  SpO2: 91% 98%    Recent laboratory studies:  Lab Results  Component Value Date   HGB 12.3 09/29/2021   HGB 13.2 09/17/2021   HGB 14.0 03/02/2020   Lab Results  Component Value Date   WBC 14.5 (H) 09/29/2021  PLT 297 09/29/2021   No results found for: INR Lab Results  Component Value Date   NA 139 09/29/2021   K 4.2 09/29/2021   CL 106 09/29/2021   CO2 29 09/29/2021   BUN 11 09/29/2021   CREATININE 0.64 09/29/2021   GLUCOSE 155 (H) 09/29/2021    Discharge Medications:   Allergies as of 09/29/2021       Reactions   Augmentin [amoxicillin-pot Clavulanate] Rash   Patient cannot remember        Medication List     TAKE these medications    acetaminophen 500 MG tablet Commonly known as: TYLENOL Take 500 mg by mouth every 6 (six) hours as needed for mild pain.   aspirin EC 325 MG tablet Take 1 tablet (325 mg total) by mouth daily.   atorvastatin 40 MG tablet Commonly known as: LIPITOR Take 40 mg by mouth at  bedtime.   CALTRATE 600+D PO Take 1 tablet by mouth daily.   CENTRUM SILVER PO Take 1 tablet by mouth daily.   PRESERVISION AREDS 2+MULTI VIT PO Take 1 tablet by mouth every morning.   diphenhydrAMINE 25 MG tablet Commonly known as: BENADRYL Take 25 mg by mouth at bedtime.   Fish Oil 600 MG Caps Take 600 mg by mouth daily.   gabapentin 100 MG capsule Commonly known as: NEURONTIN Take 100 mg by mouth at bedtime.   HYDROcodone-acetaminophen 5-325 MG tablet Commonly known as: NORCO/VICODIN Take 1-2 tablets by mouth every 4 (four) hours as needed for moderate pain (pain score 4-6).   omeprazole 20 MG tablet Commonly known as: PRILOSEC OTC Take 20 mg by mouth daily.   ondansetron 4 MG tablet Commonly known as: ZOFRAN Take 1 tablet (4 mg total) by mouth every 6 (six) hours as needed for nausea. What changed:  when to take this reasons to take this   traMADol 50 MG tablet Commonly known as: ULTRAM Take 1 tablet (50 mg total) by mouth every 6 (six) hours as needed for moderate pain.   venlafaxine XR 75 MG 24 hr capsule Commonly known as: EFFEXOR-XR Take 75 mg by mouth daily with breakfast.        Diagnostic Studies: DG Shoulder Right Port  Result Date: 09/28/2021 CLINICAL DATA:  Right total shoulder arthroplasty. EXAM: PORTABLE RIGHT SHOULDER COMPARISON:  07/15/2021 FINDINGS: The humeral and glenoid components of a reversed total shoulder arthroplasty are intact. No complicating features are identified. IMPRESSION: Well seated components of a total shoulder arthroplasty. Electronically Signed   By: Marijo Sanes M.D.   On: 09/28/2021 13:36   [place] OR NERVE BLOCK-IMAGE ONLY [facility])  Result Date: 09/28/2021 There is no interpretation for this exam.  This order is for images obtained during a surgical procedure.  Please See "Surgeries" Tab for more information regarding the procedure.    Disposition: Discharge home today pending progress with PT today.    Follow-up  Information     Lattie Corns, PA-C Follow up in 14 day(s).   Specialty: Physician Assistant Why: Electa Sniff information: [place] [id number] [phone]                Signed: Judson Roch PA-C 09/29/2021, 10:09 AM

## 2021-10-01 LAB — SURGICAL PATHOLOGY

## 2023-09-28 ENCOUNTER — Ambulatory Visit
Admission: EM | Admit: 2023-09-28 | Discharge: 2023-09-28 | Disposition: A | Discharge status: Home / Self Care | Payer: Medicare Other | Attending: Internal Medicine | Admitting: Internal Medicine

## 2023-09-28 DIAGNOSIS — N758 Other diseases of Bartholin's gland: Secondary | ICD-10-CM

## 2023-09-28 MED ORDER — SULFAMETHOXAZOLE-TRIMETHOPRIM 800-160 MG PO TABS: 1.0000 | ORAL_TABLET | Freq: Two times a day (BID) | ORAL | 0 refills | Status: AC

## 2023-09-28 NOTE — Discharge Instructions (Addendum)
Start Bactrim twice daily for 7 days.  Warm compresses to the area.  Please follow-up with your PCP in 2 days for recheck.  Please go to the ER if you develop any worsening symptoms.  I hope you feel better soon!

## 2023-09-28 NOTE — ED Triage Notes (Signed)
Pt c/o bump along the edge of her vagina x2days  Pt states that the bump hurts and feels like it is pinching.

## 2023-09-28 NOTE — ED Provider Notes (Signed)
[facility]    CSN: [id number] Arrival date & time: 09/28/23  1412      History   Chief Complaint Chief Complaint  Patient presents with   Mass    HPI April Crawford is a 77 y.o. female presents for vaginal bump.  Patient reports 1 week of a vaginal bump that is painful if she presses up against it otherwise causes her no discomfort.  It is not itchy.  Has not changed in size since she first noticed it.  There was no injury to the area.  She does not shave or trim the area.  No vaginal discharge.  No dysuria.  No history of vaginal rashes or HSV.  No OTC medications have been used since onset.  No other concerns this time.  HPI  Past Medical History:  Diagnosis Date   Arthritis    Breast cancer ([facility]) 2002   radiaiton   Cancer [facility])    breast cancer   Depression    GERD (gastroesophageal reflux disease)    Headache    migraines 1 every 6 mos or year   HOH (hard of hearing)    uses hearing aids   Hx of colonic polyps    Personal history of radiation therapy    PONV (postoperative nausea and vomiting)    not with colonoscopies   Sleep apnea     not using CPAP   Vertigo, benign positional    after been laying down    Patient Active Problem List   Diagnosis Date Noted   Status post reverse arthroplasty of shoulder, right 09/28/2021    Past Surgical History:  Procedure Laterality Date   ABDOMINAL HYSTERECTOMY     x 3 surgeries   APPENDECTOMY     BREAST EXCISIONAL BIOPSY Left 2002   pos   BREAST SURGERY     CHOLECYSTECTOMY     COLONOSCOPY     COLONOSCOPY N/A 03/11/2015   Procedure: COLONOSCOPY;  Surgeon: Wallace Cullens, MD;  Location: [facility];  Service: Gastroenterology;  Laterality: N/A;   REVERSE SHOULDER ARTHROPLASTY Right 09/28/2021   Procedure: REVERSE SHOULDER ARTHROPLASTY;  Surgeon: Christena Flake, MD;  Location: [facility];  Service: Orthopedics;  Laterality: Right;   TUBAL LIGATION      OB History   No obstetric history on file.       Home Medications    Prior to Admission medications   Medication Sig Start Date End Date Taking? Authorizing Provider  acetaminophen (TYLENOL) 500 MG tablet Take 500 mg by mouth every 6 (six) hours as needed for mild pain.   Yes [provider]  aspirin EC 325 MG tablet Take 1 tablet (325 mg total) by mouth daily. 09/29/21  Yes Anson Oregon, PA-C  atorvastatin (LIPITOR) 40 MG tablet Take 40 mg by mouth at bedtime.   Yes [provider]  Calcium Carbonate-Vitamin D (CALTRATE 600+D PO) Take 1 tablet by mouth daily.   Yes [provider]  gabapentin (NEURONTIN) 100 MG capsule Take 100 mg by mouth at bedtime.   Yes [provider]  Multiple Vitamins-Minerals (CENTRUM SILVER PO) Take 1 tablet by mouth daily.   Yes [provider]  Multiple Vitamins-Minerals (PRESERVISION AREDS 2+MULTI VIT PO) Take 1 tablet by mouth every morning.   Yes [provider]  Omega-3 Fatty Acids (FISH OIL) 600 MG CAPS Take 600 mg by mouth daily.   Yes [provider]  sulfamethoxazole-trimethoprim (BACTRIM DS) 800-160 MG tablet Take  1 tablet by mouth 2 (two) times daily for 7 days. 09/28/23 10/05/23 Yes Radford Pax, NP  traMADol (ULTRAM) 50 MG tablet Take 1 tablet (50 mg total) by mouth every 6 (six) hours as needed for moderate pain. 09/29/21  Yes Anson Oregon, PA-C  venlafaxine XR (EFFEXOR-XR) 75 MG 24 hr capsule Take 75 mg by mouth daily with breakfast.   Yes [provider]  diphenhydrAMINE (BENADRYL) 25 MG tablet Take 25 mg by mouth at bedtime.    [provider]  HYDROcodone-acetaminophen (NORCO/VICODIN) 5-325 MG tablet Take 1-2 tablets by mouth every 4 (four) hours as needed for moderate pain (pain score 4-6). 09/29/21   Anson Oregon, PA-C  omeprazole (PRILOSEC OTC) 20 MG tablet Take 20 mg by mouth daily.    [provider]  ondansetron (ZOFRAN) 4 MG tablet Take 1 tablet (4 mg total) by mouth every 6  (six) hours as needed for nausea. 09/29/21   Anson Oregon, PA-C    Family History Family History  Problem Relation Age of Onset   Alzheimer's disease Mother    Lung cancer Father    Hypertension Father    Breast cancer Neg Hx     Social History Social History   Tobacco Use   Smoking status: Never   Smokeless tobacco: Never  Vaping Use   Vaping status: Never Used  Substance Use Topics   Alcohol use: No   Drug use: Never     Allergies   Augmentin [amoxicillin-pot clavulanate]   Review of Systems Review of Systems  Genitourinary:        Vaginal bump     Physical Exam Triage Vital Signs ED Triage Vitals  Encounter Vitals Group     BP 09/28/23 1451 134/69     Systolic BP Percentile --      Diastolic BP Percentile --      Pulse Rate 09/28/23 1451 75     Resp --      Temp 09/28/23 1451 98.1 F (36.7 C)     Temp Source 09/28/23 1451 Oral     SpO2 09/28/23 1451 97 %     Weight --      Height 09/28/23 1448 5\' 2"  (1.575 m)     Head Circumference --      Peak Flow --      Pain Score 09/28/23 1448 3     Pain Loc --      Pain Education --      Exclude from Growth Chart --    No data found.  Updated Vital Signs BP 134/69 (BP Location: Left Arm)   Pulse 75   Temp 98.1 F (36.7 C) (Oral)   Ht 5\' 2"  (1.575 m)   SpO2 97%   BMI 32.01 kg/m   Visual Acuity Right Eye Distance:   Left Eye Distance:   Bilateral Distance:    Right Eye Near:   Left Eye Near:    Bilateral Near:     Physical Exam Vitals and nursing note reviewed. Exam conducted with a chaperone present Insurance claims handler).  Constitutional:      General: She is not in acute distress.    Appearance: Normal appearance. She is not ill-appearing.  HENT:     Head: Normocephalic and atraumatic.  Eyes:     Pupils: Pupils are equal, round, and reactive to light.  Cardiovascular:     Rate and Rhythm: Normal rate.  Pulmonary:     Effort: Pulmonary effort is normal.  Genitourinary:    Pubic Area: No  rash.        Comments: Mild swelling and erythema of the right labia consistent with Bartholin cyst infection.  No induration or fluctuance. Skin:    General: Skin is warm and dry.  Neurological:     General: No focal deficit present.     Mental Status: She is alert and oriented to person, place, and time.  Psychiatric:        Mood and Affect: Mood normal.        Behavior: Behavior normal.      UC Treatments / Results  Labs (all labs ordered are listed, but only abnormal results are displayed) Labs Reviewed - No data to display  EKG   Radiology No results found.  Procedures Procedures (including critical care time)  Medications Ordered in UC Medications - No data to display  Initial Impression / Assessment and Plan / UC Course  I have reviewed the triage vital signs and the nursing notes.  Pertinent labs & imaging results that were available during my care of the patient were reviewed by me and considered in my medical decision making (see chart for details).     Reviewed exam and symptoms with patient.  Discussed part following with cyst infection, no indication for I&D at this time.  Will start Bactrim and encouraged warm compresses.  Advised if symptoms worsen she is to go to the emergency room for further workup and possible I&D and she verbalized understanding.  PCP follow-up 2 days for recheck. Final Clinical Impressions(s) / UC Diagnoses   Final diagnoses:  Bartholin's gland infection     Discharge Instructions      Start Bactrim twice daily for 7 days.  Warm compresses to the area.  Please follow-up with your PCP in 2 days for recheck.  Please go to the ER if you develop any worsening symptoms.  I hope you feel better soon!     ED Prescriptions     Medication Sig Dispense Auth. Provider   sulfamethoxazole-trimethoprim (BACTRIM DS) 800-160 MG tablet Take 1 tablet by mouth 2 (two) times daily for 7 days. 14 tablet Radford Pax, NP      PDMP not  reviewed this encounter.   Radford Pax, NP 09/28/23 1520
